# Patient Record
Sex: Male | Born: 1990
Health system: Southern US, Community
[De-identification: ages and names within clinical notes are randomized; demographics above are authoritative.]

## PROBLEM LIST (undated history)

## (undated) DIAGNOSIS — F419 Anxiety disorder, unspecified: Secondary | ICD-10-CM

## (undated) DIAGNOSIS — L0501 Pilonidal cyst with abscess: Secondary | ICD-10-CM

## (undated) DIAGNOSIS — I10 Essential (primary) hypertension: Secondary | ICD-10-CM

## (undated) DIAGNOSIS — R569 Unspecified convulsions: Secondary | ICD-10-CM

## (undated) DIAGNOSIS — F32A Depression, unspecified: Secondary | ICD-10-CM

## (undated) DIAGNOSIS — F329 Major depressive disorder, single episode, unspecified: Secondary | ICD-10-CM

## (undated) DIAGNOSIS — F1910 Other psychoactive substance abuse, uncomplicated: Secondary | ICD-10-CM

## (undated) DIAGNOSIS — T50901A Poisoning by unspecified drugs, medicaments and biological substances, accidental (unintentional), initial encounter: Secondary | ICD-10-CM

## (undated) HISTORY — DX: Major depressive disorder, single episode, unspecified: F32.9

## (undated) HISTORY — PX: INCISE AND DRAIN ABCESS: PRO64

## (undated) HISTORY — DX: Anxiety disorder, unspecified: F41.9

## (undated) HISTORY — DX: Pilonidal cyst with abscess: L05.01

## (undated) HISTORY — DX: Depression, unspecified: F32.A

---
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
## 2008-04-24 ENCOUNTER — Emergency Department (HOSPITAL_COMMUNITY): Admission: EM | Admit: 2008-04-24 | Discharge: 2008-04-24 | Payer: Self-pay | Admitting: Emergency Medicine

## 2009-03-12 ENCOUNTER — Emergency Department (HOSPITAL_COMMUNITY): Admission: EM | Admit: 2009-03-12 | Discharge: 2009-03-13 | Payer: Self-pay | Admitting: Emergency Medicine

## 2010-07-10 LAB — COMPREHENSIVE METABOLIC PANEL
ALT: 13 U/L (ref 0–53)
AST: 20 U/L (ref 0–37)
Albumin: 4.4 g/dL (ref 3.5–5.2)
Alkaline Phosphatase: 69 U/L (ref 39–117)
BUN: 7 mg/dL (ref 6–23)
Calcium: 9 mg/dL (ref 8.4–10.5)
Chloride: 104 mEq/L (ref 96–112)
GFR calc non Af Amer: >60 mL/min (ref >60)
Total Bilirubin: 0.5 mg/dL (ref 0.3–1.2)

## 2010-07-10 LAB — URINALYSIS, ROUTINE W REFLEX MICROSCOPIC
Bilirubin Urine: NEGATIVE
Ketones, ur: NEGATIVE mg/dL
Leukocytes, UA: NEGATIVE
Nitrite: NEGATIVE
Specific Gravity, Urine: 1.013 (ref 1.005–1.030)

## 2010-07-10 LAB — RAPID URINE DRUG SCREEN, HOSP PERFORMED
Amphetamines: NOT DETECTED
Barbiturates: NOT DETECTED
Opiates: NOT DETECTED
Tetrahydrocannabinol: POSITIVE — AB

## 2010-07-10 LAB — ETHANOL: Alcohol, Ethyl (B): <5 mg/dL (ref 0–10)

## 2010-07-10 LAB — POCT CARDIAC MARKERS
CKMB, poc: <1 ng/mL — ABNORMAL LOW (ref 1.0–8.0)
Myoglobin, poc: 108 ng/mL (ref 12–200)
Troponin i, poc: <0.05 ng/mL (ref 0.00–0.09)

## 2010-07-10 LAB — CBC
HCT: 45.2 % (ref 39.0–52.0)
Hemoglobin: 15.4 g/dL (ref 13.0–17.0)
MCHC: 34 g/dL (ref 30.0–36.0)
RBC: 4.96 MIL/uL (ref 4.22–5.81)
WBC: 15.6 10*3/uL — ABNORMAL HIGH (ref 4.0–10.5)

## 2010-07-10 LAB — DIFFERENTIAL
Basophils Absolute: 0 10*3/uL (ref 0.0–0.1)
Eosinophils Absolute: 0.2 10*3/uL (ref 0.0–0.7)
Monocytes Relative: 7 % (ref 3–12)

## 2010-07-10 LAB — URINE MICROSCOPIC-ADD ON

## 2010-11-29 ENCOUNTER — Emergency Department (HOSPITAL_BASED_OUTPATIENT_CLINIC_OR_DEPARTMENT_OTHER)
Admission: EM | Admit: 2010-11-29 | Discharge: 2010-11-29 | Disposition: A | Discharge status: Home / Self Care | Payer: Managed Care, Other (non HMO) | Attending: Emergency Medicine | Admitting: Emergency Medicine

## 2010-11-29 ENCOUNTER — Encounter: Payer: Self-pay | Admitting: *Deleted

## 2010-11-29 DIAGNOSIS — E860 Dehydration: Secondary | ICD-10-CM | POA: Insufficient documentation

## 2010-11-29 DIAGNOSIS — F17200 Nicotine dependence, unspecified, uncomplicated: Secondary | ICD-10-CM | POA: Insufficient documentation

## 2010-11-29 DIAGNOSIS — R5381 Other malaise: Secondary | ICD-10-CM | POA: Insufficient documentation

## 2010-11-29 DIAGNOSIS — T675XXA Heat exhaustion, unspecified, initial encounter: Secondary | ICD-10-CM | POA: Insufficient documentation

## 2010-11-29 DIAGNOSIS — X30XXXA Exposure to excessive natural heat, initial encounter: Secondary | ICD-10-CM | POA: Insufficient documentation

## 2010-11-29 LAB — BASIC METABOLIC PANEL
Calcium: 10 mg/dL (ref 8.4–10.5)
Chloride: 96 mEq/L (ref 96–112)
GFR calc Af Amer: 57 mL/min — ABNORMAL LOW (ref >60)
GFR calc non Af Amer: 47 mL/min — ABNORMAL LOW (ref >60)

## 2010-11-29 MED ORDER — SODIUM CHLORIDE 0.9 % IV BOLUS (SEPSIS)
1000.0000 mL | Freq: Once | INTRAVENOUS | Status: AC
  Administered 2010-11-29: 1000 mL via INTRAVENOUS

## 2010-11-29 NOTE — ED Notes (Signed)
Pt to room 1 by ems via stretcher. Pt coming from his work, per pt he has been working outside for the last few days with little po intake.  Pt is alert, sleepy, states he did not sleep last night and he "feels tired".

## 2010-11-29 NOTE — ED Provider Notes (Signed)
History     CSN: [id number] Arrival date & time: 11/29/2010  4:17 PM  Chief Complaint  Patient presents with  . Heat Exposure   HPI Comments: Pt come in today complaining of generalized fatigue.Pt states that he works out in the heat and just feels like he couldn't keep up with his hydration today:pt states that he only slept 2 hours yesterday;pt states that he drank some etoh last night  The history is provided by the patient. No language interpreter was used.    Past Medical History  Diagnosis Date  . Migraine     History reviewed. No pertinent past surgical history.  History reviewed. No pertinent family history.  History  Substance Use Topics  . Smoking status: Current Everyday Smoker  . Smokeless tobacco: Not on file  . Alcohol Use: Yes      Review of Systems  All other systems reviewed and are negative.    Physical Exam  BP 113/71  Pulse 91  Temp(Src) 98.5 F (36.9 C) (Oral)  Resp 16  SpO2 97%  Physical Exam  Nursing note and vitals reviewed. Constitutional: He is oriented to person, place, and time. He appears well-developed.  HENT:  Head: Normocephalic and atraumatic.  Eyes: Pupils are equal, round, and reactive to light.  Neck: Normal range of motion. Neck supple.  Cardiovascular: Normal rate and regular rhythm.   Pulmonary/Chest: Effort normal and breath sounds normal.  Abdominal: Soft. Bowel sounds are normal.  Musculoskeletal: Normal range of motion.  Neurological: He is alert and oriented to person, place, and time.  Skin: Skin is warm and dry.  Psychiatric: He has a normal mood and affect. His behavior is normal.    ED Course  Procedures Results for orders placed during the hospital encounter of 11/29/10  BASIC METABOLIC PANEL      Component Value Range   Sodium 135  135 - 145 (mEq/L)   Potassium 3.9  3.5 - 5.1 (mEq/L)   Chloride 96  96 - 112 (mEq/L)   CO2 24  19 - 32 (mEq/L)   Glucose, Bld 111 (*) 70 - 99 (mg/dL)   BUN 19  6 - 23  (mg/dL)   Creatinine, Ser 8.29 (*) 0.50 - 1.35 (mg/dL)   Calcium 56.2  8.4 - 10.5 (mg/dL)   GFR calc non Af Amer 47 (*) >60 (mL/min)   GFR calc Af Amer 57 (*) >60 (mL/min)   No results found.  MDM Pt given 2 liters of fluids here:will have pt have his labs redrawn at pcp office:pt feeling better at time:pt tolerating po without any problem:pt is ambulatory without any problem  Medical screening examination/treatment/procedure(s) were conducted as a shared visit with non-physician practitioner(s) and myself.  I personally evaluated the patient during the encounter Pt seen --> he had been gathering up shopping carts outside a store and felt weak.  He had had only a couple of hours asleep the night before and had done some drinking that night.  Exam shows him to be somewhat sunburned.  Lungs clear, heartsounds normal, abdomen soft and non-tender.  Neurologically intact.  He was rehydrated and improved.    Teressa Lower, NP 11/29/10 1922  Carleene Cooper III, MD 11/30/10 1351

## 2011-01-15 LAB — OCCULT BLOOD X 1 CARD TO LAB, STOOL: Fecal Occult Bld: POSITIVE

## 2011-03-20 ENCOUNTER — Ambulatory Visit (INDEPENDENT_AMBULATORY_CARE_PROVIDER_SITE_OTHER): Payer: Managed Care, Other (non HMO) | Admitting: Surgery

## 2011-03-20 ENCOUNTER — Encounter (INDEPENDENT_AMBULATORY_CARE_PROVIDER_SITE_OTHER): Payer: Self-pay | Admitting: Surgery

## 2011-03-20 ENCOUNTER — Other Ambulatory Visit (INDEPENDENT_AMBULATORY_CARE_PROVIDER_SITE_OTHER): Payer: Self-pay | Admitting: Surgery

## 2011-03-20 VITALS — BP 122/84 | HR 98 | Temp 97.6°F | Resp 16 | Ht 75.0 in | Wt 242.0 lb

## 2011-03-20 DIAGNOSIS — L0501 Pilonidal cyst with abscess: Secondary | ICD-10-CM

## 2011-03-20 HISTORY — DX: Pilonidal cyst with abscess: L05.01

## 2011-03-20 MED ORDER — CEPHALEXIN 500 MG PO CAPS: 500.0000 mg | ORAL_CAPSULE | Freq: Four times a day (QID) | ORAL | Status: AC

## 2011-03-20 NOTE — Progress Notes (Signed)
Chief complaint: Abscess  History of present illness: This patient presents to our urgent office with a five day history of pain around his tailbone which has been getting worse. He has been told by his family there is some redness in that they thought he had an abscess. He's never had anything like this in the past. He otherwise considers himself to be in good health.  Past history and family history etc. are all already in the electronic medical record and reviewed.  Physical exam:  Gen.: The patient is alert oriented and generally healthy-appearing. He is in no distress. Back: There is an abscess in the pilonidal area presenting just to the left of the midline. There are at least two sinus tract openings. The area is red, fluctuant, and tender.  Impression acute pilonidal abscess with cyst  Plan: I think this needs to be opened and drained. I discussed that with the patient. I told him that he would need to start on some antibiotics as well. I explained that we would not be able to close this but it would have to heal from the "bottom up." I also told him that he may have to have subsequent surgery for excision depending on how this heals. The patient was agreeable to having this open this.  Procedure note: The area of the pilonidal abscess was prepped with Betadine and anesthetized with 1% Xylocaine with epinephrine. I waited 10 minutes for good anesthesia and hemostatic effect.  The area was reprepped with Betadine. I made an elliptical incision close to the midline but over the most fluctuant area and entered an abscess cavity. Foul-smelling purulent material was drained and cultured. When I saw and all the fluid drained and placed a small pack with a 2 x 2.  The patient tolerated the procedure well.  He will return in two days for dressing change by the nurse. I will see him in a week. He will start Keflex 500 q.i.d.  

## 2011-03-20 NOTE — Patient Instructions (Signed)
Change dressing daily. Start antibiotics as prescribed. You may shower or do Sitz baths starting tomorrow. We should see you again in about a week

## 2011-03-23 LAB — WOUND CULTURE

## 2011-03-26 ENCOUNTER — Encounter (INDEPENDENT_AMBULATORY_CARE_PROVIDER_SITE_OTHER): Payer: Self-pay | Admitting: Surgery

## 2011-03-26 ENCOUNTER — Ambulatory Visit (INDEPENDENT_AMBULATORY_CARE_PROVIDER_SITE_OTHER): Payer: Managed Care, Other (non HMO) | Admitting: Surgery

## 2011-03-26 VITALS — BP 118/88 | HR 68 | Temp 96.8°F | Resp 16 | Ht 75.0 in | Wt 240.6 lb

## 2011-03-26 DIAGNOSIS — L0501 Pilonidal cyst with abscess: Secondary | ICD-10-CM

## 2011-03-26 NOTE — Progress Notes (Signed)
Chief complaint: Followup after drainage of pilonidal abscess done in the office  History of present illness: The patient presented last week to our urgent office with a pilonidal abscess. This was drained. The patient believes he is doing better and is feeling a lot less pain. He continues to have a little bit of drainage. He considers himself to be much improved from prior to drainage of the abscess.  Exam: The abscess is starting to close up although there is still a little purulent drainage. Just inferior to the I&D site is a small pilonidal sinus opening which had some hair growing out of it. I removed that.  Impression improving pilonidal abscess  Plan: We will continue him on antibiotics. He'll follow up with me in 2-3 weeks. I told him that this may completely resolve or that he may end up having to have a surgical excision but that we can't tell yet whether he will need surgery or not. We can make a better judgment after we see how this heals over the next few weeks.

## 2011-03-26 NOTE — Patient Instructions (Signed)
Continue current treatment and finish all the antibiotics. See me again in 2-3 weeks

## 2011-04-08 ENCOUNTER — Ambulatory Visit (INDEPENDENT_AMBULATORY_CARE_PROVIDER_SITE_OTHER): Payer: Managed Care, Other (non HMO) | Admitting: Surgery

## 2011-04-08 ENCOUNTER — Encounter (INDEPENDENT_AMBULATORY_CARE_PROVIDER_SITE_OTHER): Payer: Self-pay | Admitting: Surgery

## 2011-04-08 VITALS — BP 122/90 | HR 73 | Temp 97.2°F | Ht 75.0 in | Wt 247.0 lb

## 2011-04-08 DIAGNOSIS — L0501 Pilonidal cyst with abscess: Secondary | ICD-10-CM

## 2011-04-08 MED ORDER — OXYCODONE-ACETAMINOPHEN 10-325 MG PO TABS: 1.0000 | ORAL_TABLET | Freq: Four times a day (QID) | ORAL | Status: AC | PRN

## 2011-04-08 NOTE — Progress Notes (Signed)
NAME: Bradley Hanson                                            DOB: 1991/04/01 DATE: 04/08/2011                                                  MRN: [id number]  CC: Post op   HPI: This patient comes in for post op follow-up.Heunderwent I&D of pilonidal on 12/18. He feels that he is doing well.He notes that he takes percocet for migraine and used some for his pilnidal abscess and is out. Will rev  PE: General: The patient appears to be healthy, NAD Abscess has healed  DATA REVIEWED: No new  IMPRESSION: The patient is doing well S/P I&D pilonidal. Discussed this with him and told him he may have recurrences and may need more definitive surgery. Marland Kitchen    PLAN: RTC PRN

## 2011-09-10 ENCOUNTER — Telehealth (INDEPENDENT_AMBULATORY_CARE_PROVIDER_SITE_OTHER): Payer: Self-pay

## 2011-09-10 DIAGNOSIS — L0591 Pilonidal cyst without abscess: Secondary | ICD-10-CM

## 2011-09-10 MED ORDER — CEPHALEXIN 500 MG PO CAPS: 500.0000 mg | ORAL_CAPSULE | Freq: Four times a day (QID) | ORAL | Status: AC

## 2011-09-10 NOTE — Telephone Encounter (Signed)
The dad called and reports the patient's pilonidal cyst is inflamed again.  It drained last night.  He was seen in December and was on Keflex at one point.  He uses [facility] on [address].  I spoke to Dr Jamey Ripa who gave the order for Keflex 500 mg po qid #40 no refills.  I called this to [facility] [phone].  I made an appointment for tomorrow in urgent clinic.

## 2011-09-11 ENCOUNTER — Encounter (INDEPENDENT_AMBULATORY_CARE_PROVIDER_SITE_OTHER): Payer: Self-pay | Admitting: Surgery

## 2011-09-11 ENCOUNTER — Ambulatory Visit (INDEPENDENT_AMBULATORY_CARE_PROVIDER_SITE_OTHER): Payer: Managed Care, Other (non HMO) | Admitting: Surgery

## 2011-09-11 VITALS — BP 125/96 | HR 111 | Temp 98.1°F | Ht 75.0 in | Wt 231.0 lb

## 2011-09-11 DIAGNOSIS — L0501 Pilonidal cyst with abscess: Secondary | ICD-10-CM

## 2011-09-11 NOTE — Progress Notes (Signed)
Subjective:     Patient ID: Bradley Hanson, male   DOB: [birth year], 21 y.o.   MRN: [id number]  HPI This is a gentleman with a history of possible abscesses in the past. His last time he was drained was in December of last year for his report. He is now developed another area of tenderness in his gluteal cleft. He will occasionally push on it and allowed to drain on its own. He has just been started on Keflex  Review of Systems     Objective:   Physical Exam On exam, he has a small abscess in the gluteal cleft consistent with a pilonidal infection. I prepped the area Betadine, made an incision with a scalpel, and drained a small abscess cavity which had been packed with gauze    Assessment:     Pilonidal abscess    Plan:     He will continue his Keflex. I wrote in for a few Percocet. Wound care instructions were given. I will see him back in 3 or 4 weeks to discuss potential for definitive surgery.

## 2011-09-13 ENCOUNTER — Telehealth (INDEPENDENT_AMBULATORY_CARE_PROVIDER_SITE_OTHER): Payer: Self-pay | Admitting: General Surgery

## 2011-09-13 NOTE — Telephone Encounter (Signed)
Pt called in stating he had pilo cyst I&D on 09/11/11 by Magnus Ivan. Pt requested additional percocet stating it was still hurting really bad. The patient sounded sluggish on the phone. I advised him a call would be made to Dr. Magnus Ivan to ask if the script can be issued. Patient advised he can be called back on home # [phone] or his cell # [phone].  Dr. Magnus Ivan called back and advised he was not going to issue any further pain medication to this patient. Advised he can use aleve or ibuprofen.  Called patient back and advised of what Dr.Blackman stated. Patient said o.k.

## 2011-09-30 ENCOUNTER — Telehealth (INDEPENDENT_AMBULATORY_CARE_PROVIDER_SITE_OTHER): Payer: Self-pay | Admitting: General Surgery

## 2011-09-30 MED ORDER — OXYCODONE-ACETAMINOPHEN 5-325 MG PO TABS: 1.0000 | ORAL_TABLET | Freq: Four times a day (QID) | ORAL | Status: AC | PRN

## 2011-09-30 NOTE — Telephone Encounter (Signed)
Pt calling to request additional antibx for abscess on buttock.  Paged and updated Dr. Magnus Ivan; orders received for Keflex 500 mg, #30, 1 po TID x 10 days, no refill.  Called meds to [facility]:  [phone] and notified pt to pick up.

## 2011-09-30 NOTE — Telephone Encounter (Signed)
Patient called status post appts for pilonidal cyst in office. Last appt was 09/11/2011. He was given Percocet at that time for pain. Patient states this area has opened again and is very painful and is asking for percocet refill. Dr Magnus Ivan paged. Ok per Dr Magnus Ivan to refill #30 and to have partner write RX. Dr Michaell Cowing signed RX at front for patient pick up. Patient aware.

## 2011-10-04 ENCOUNTER — Encounter (INDEPENDENT_AMBULATORY_CARE_PROVIDER_SITE_OTHER): Payer: Self-pay | Admitting: Surgery

## 2011-10-09 ENCOUNTER — Ambulatory Visit (INDEPENDENT_AMBULATORY_CARE_PROVIDER_SITE_OTHER): Payer: Managed Care, Other (non HMO) | Admitting: Surgery

## 2011-10-09 ENCOUNTER — Encounter (INDEPENDENT_AMBULATORY_CARE_PROVIDER_SITE_OTHER): Payer: Self-pay | Admitting: Surgery

## 2011-10-09 VITALS — BP 136/94 | HR 66 | Temp 97.6°F | Ht 75.0 in | Wt 229.2 lb

## 2011-10-09 DIAGNOSIS — L0501 Pilonidal cyst with abscess: Secondary | ICD-10-CM

## 2011-10-09 NOTE — Progress Notes (Signed)
Subjective:     Patient ID: Bradley Hanson, male   DOB: [birth year], 21 y.o.   MRN: [id number]  HPI He has had no problem since I saw him last for his pilonidal abscess. He has completely healed and has no complaints.  Review of Systems     Objective:   Physical Exam On exam, there are chronic sinus tracts without drainage and without evidence of infection at the gluteal cleft    Assessment:     Chronic pilonidal cyst    Plan:     For now, he will to hold on definitive surgery which I feel is reasonable. He will call me as soon as possible when he has another flareup and we will start him on antibiotics right away

## 2011-11-12 ENCOUNTER — Encounter (INDEPENDENT_AMBULATORY_CARE_PROVIDER_SITE_OTHER): Payer: Self-pay | Admitting: Surgery

## 2011-11-12 ENCOUNTER — Ambulatory Visit (INDEPENDENT_AMBULATORY_CARE_PROVIDER_SITE_OTHER): Payer: Managed Care, Other (non HMO) | Admitting: Surgery

## 2011-11-12 VITALS — BP 118/64 | HR 72 | Temp 97.8°F | Resp 12 | Ht 75.0 in | Wt 230.8 lb

## 2011-11-12 DIAGNOSIS — L0501 Pilonidal cyst with abscess: Secondary | ICD-10-CM

## 2011-11-12 NOTE — Progress Notes (Signed)
Subjective:     Patient ID: Bradley Hanson, male   DOB: Sep 29, 1990, 21 y.o.   MRN: [id number]  HPI He is here for another evaluation of his pilonidal abscess.  It has recurred and spontaneously drained last evening.  Review of Systems     Objective:   Physical Exam    on exam, there is a small abscess cavity which I probed with a Q-tip. This is at the gluteal cleft. Assessment:     Recurrent pilonidal cyst with abscess    Plan:     I am going to place him back on antibiotics. I will now schedule him for definitive surgery. Risks were discussed with the patient and his father

## 2011-11-18 ENCOUNTER — Telehealth (INDEPENDENT_AMBULATORY_CARE_PROVIDER_SITE_OTHER): Payer: Self-pay | Admitting: General Surgery

## 2011-11-18 NOTE — Telephone Encounter (Signed)
PT CALLED TO REQUEST PAIN MEDICATION REFILL. SEEN IN OFFICE LAST WEEK FOR PILONIDAL CYST/ REVIEWED WITH DR. BLACKMAN AND HE APPROVED A CALL IN FOR HYDROCODONE 5/325 #30 PER PROTOCOL/ CALLED TO [facility] [address] [phone]. PT NOTIFIED/GY

## 2013-12-18 ENCOUNTER — Encounter (HOSPITAL_COMMUNITY): Payer: Self-pay | Admitting: Emergency Medicine

## 2013-12-18 ENCOUNTER — Inpatient Hospital Stay (HOSPITAL_COMMUNITY)
Admission: EM | Admit: 2013-12-18 | Discharge: 2013-12-23 | DRG: 137 | Disposition: A | Discharge status: Home / Self Care | Payer: Self-pay | Attending: Family Medicine | Admitting: Family Medicine

## 2013-12-18 ENCOUNTER — Emergency Department (HOSPITAL_COMMUNITY): Payer: Self-pay

## 2013-12-18 DIAGNOSIS — Z598 Other problems related to housing and economic circumstances: Secondary | ICD-10-CM

## 2013-12-18 DIAGNOSIS — K029 Dental caries, unspecified: Secondary | ICD-10-CM | POA: Diagnosis present

## 2013-12-18 DIAGNOSIS — K0401 Reversible pulpitis: Secondary | ICD-10-CM | POA: Diagnosis present

## 2013-12-18 DIAGNOSIS — Z5987 Material hardship due to limited financial resources, not elsewhere classified: Secondary | ICD-10-CM

## 2013-12-18 DIAGNOSIS — M272 Inflammatory conditions of jaws: Secondary | ICD-10-CM | POA: Diagnosis present

## 2013-12-18 DIAGNOSIS — L0501 Pilonidal cyst with abscess: Secondary | ICD-10-CM

## 2013-12-18 DIAGNOSIS — F17200 Nicotine dependence, unspecified, uncomplicated: Secondary | ICD-10-CM | POA: Diagnosis present

## 2013-12-18 DIAGNOSIS — K047 Periapical abscess without sinus: Principal | ICD-10-CM | POA: Diagnosis present

## 2013-12-18 DIAGNOSIS — Z23 Encounter for immunization: Secondary | ICD-10-CM

## 2013-12-18 DIAGNOSIS — F3289 Other specified depressive episodes: Secondary | ICD-10-CM | POA: Diagnosis present

## 2013-12-18 DIAGNOSIS — L0201 Cutaneous abscess of face: Secondary | ICD-10-CM | POA: Diagnosis present

## 2013-12-18 DIAGNOSIS — K0530 Chronic periodontitis, unspecified: Secondary | ICD-10-CM | POA: Diagnosis present

## 2013-12-18 DIAGNOSIS — F329 Major depressive disorder, single episode, unspecified: Secondary | ICD-10-CM | POA: Diagnosis present

## 2013-12-18 DIAGNOSIS — L03211 Cellulitis of face: Secondary | ICD-10-CM

## 2013-12-18 DIAGNOSIS — K045 Chronic apical periodontitis: Secondary | ICD-10-CM | POA: Diagnosis present

## 2013-12-18 DIAGNOSIS — K036 Deposits [accretions] on teeth: Secondary | ICD-10-CM | POA: Diagnosis present

## 2013-12-18 DIAGNOSIS — F411 Generalized anxiety disorder: Secondary | ICD-10-CM | POA: Diagnosis present

## 2013-12-18 DIAGNOSIS — X58XXXA Exposure to other specified factors, initial encounter: Secondary | ICD-10-CM | POA: Diagnosis present

## 2013-12-18 LAB — I-STAT CHEM 8, ED
BUN: 12 mg/dL (ref 6–23)
CREATININE: 1.4 mg/dL — AB (ref 0.50–1.35)
Calcium, Ion: 1.21 mmol/L (ref 1.12–1.23)
Chloride: 104 mEq/L (ref 96–112)
Glucose, Bld: 103 mg/dL — ABNORMAL HIGH (ref 70–99)
HCT: 49 % (ref 39.0–52.0)
HEMOGLOBIN: 16.7 g/dL (ref 13.0–17.0)
POTASSIUM: 4.4 meq/L (ref 3.7–5.3)
SODIUM: 138 meq/L (ref 137–147)
TCO2: 31 mmol/L (ref 0–100)

## 2013-12-18 LAB — CBC
HCT: 45.2 % (ref 39.0–52.0)
HCT: 43 % (ref 39.0–52.0)
HEMOGLOBIN: 14.7 g/dL (ref 13.0–17.0)
Hemoglobin: 14.1 g/dL (ref 13.0–17.0)
MCH: 30 pg (ref 26.0–34.0)
MCH: 29.9 pg (ref 26.0–34.0)
MCHC: 32.5 g/dL (ref 30.0–36.0)
MCHC: 32.8 g/dL (ref 30.0–36.0)
MCV: 92.2 fL (ref 78.0–100.0)
MCV: 91.3 fL (ref 78.0–100.0)
PLATELETS: 340 10*3/uL (ref 150–400)
Platelets: 242 10*3/uL (ref 150–400)
RBC: 4.9 MIL/uL (ref 4.22–5.81)
RBC: 4.71 MIL/uL (ref 4.22–5.81)
RDW: 13.6 % (ref 11.5–15.5)
RDW: 13.6 % (ref 11.5–15.5)
WBC: 12.8 10*3/uL — ABNORMAL HIGH (ref 4.0–10.5)
WBC: 11.3 10*3/uL — ABNORMAL HIGH (ref 4.0–10.5)

## 2013-12-18 LAB — CREATININE, SERUM
CREATININE: 1.03 mg/dL (ref 0.50–1.35)
GFR calc Af Amer: >90 mL/min (ref >90)
GFR calc non Af Amer: >90 mL/min (ref >90)

## 2013-12-18 LAB — MRSA PCR SCREENING: MRSA by PCR: NEGATIVE

## 2013-12-18 MED ORDER — ONDANSETRON HCL 4 MG PO TABS: 4.0000 mg | ORAL_TABLET | Freq: Four times a day (QID) | ORAL | Status: DC | PRN

## 2013-12-18 MED ORDER — MORPHINE SULFATE 2 MG/ML IJ SOLN
1.0000 mg | INTRAMUSCULAR | Status: DC | PRN
  Administered 2013-12-18 – 2013-12-20 (×11): 2 mg via INTRAVENOUS
  Filled 2013-12-18 (×11): qty 1

## 2013-12-18 MED ORDER — HYDROMORPHONE HCL PF 1 MG/ML IJ SOLN
1.0000 mg | Freq: Once | INTRAMUSCULAR | Status: AC
  Administered 2013-12-18: 1 mg via INTRAVENOUS
  Filled 2013-12-18: qty 1

## 2013-12-18 MED ORDER — ONDANSETRON HCL 4 MG/2ML IJ SOLN: 4.0000 mg | Freq: Three times a day (TID) | INTRAMUSCULAR | Status: DC | PRN

## 2013-12-18 MED ORDER — CLONAZEPAM 1 MG PO TABS
1.0000 mg | ORAL_TABLET | Freq: Three times a day (TID) | ORAL | Status: DC | PRN
  Administered 2013-12-18 – 2013-12-23 (×12): 1 mg via ORAL
  Filled 2013-12-18 (×12): qty 1

## 2013-12-18 MED ORDER — PNEUMOCOCCAL VAC POLYVALENT 25 MCG/0.5ML IJ INJ
0.5000 mL | INJECTION | INTRAMUSCULAR | Status: DC
  Filled 2013-12-18: qty 0.5

## 2013-12-18 MED ORDER — SODIUM CHLORIDE 0.9 % IV SOLN
INTRAVENOUS | Status: DC
  Administered 2013-12-18 – 2013-12-20 (×4): via INTRAVENOUS

## 2013-12-18 MED ORDER — IOHEXOL 300 MG/ML  SOLN
75.0000 mL | Freq: Once | INTRAMUSCULAR | Status: AC | PRN
  Administered 2013-12-18: 75 mL via INTRAVENOUS

## 2013-12-18 MED ORDER — SODIUM CHLORIDE 0.9 % IV BOLUS (SEPSIS)
1000.0000 mL | Freq: Once | INTRAVENOUS | Status: AC
  Administered 2013-12-18: 1000 mL via INTRAVENOUS

## 2013-12-18 MED ORDER — CLINDAMYCIN PHOSPHATE 600 MG/50ML IV SOLN
600.0000 mg | Freq: Once | INTRAVENOUS | Status: AC
Start: 2013-12-18 — End: 2013-12-18
  Administered 2013-12-18: 600 mg via INTRAVENOUS
  Filled 2013-12-18: qty 50

## 2013-12-18 MED ORDER — GUAIFENESIN-DM 100-10 MG/5ML PO SYRP: 5.0000 mL | ORAL_SOLUTION | ORAL | Status: DC | PRN

## 2013-12-18 MED ORDER — CLINDAMYCIN PHOSPHATE 600 MG/50ML IV SOLN
600.0000 mg | Freq: Four times a day (QID) | INTRAVENOUS | Status: DC
  Administered 2013-12-18 – 2013-12-23 (×20): 600 mg via INTRAVENOUS
  Filled 2013-12-18 (×23): qty 50

## 2013-12-18 MED ORDER — OXYCODONE HCL 5 MG PO TABS
5.0000 mg | ORAL_TABLET | ORAL | Status: DC | PRN
  Administered 2013-12-18 (×2): 5 mg via ORAL
  Filled 2013-12-18 (×2): qty 1

## 2013-12-18 MED ORDER — OXYCODONE-ACETAMINOPHEN 5-325 MG PO TABS
1.0000 | ORAL_TABLET | Freq: Once | ORAL | Status: AC
  Administered 2013-12-18: 1 via ORAL
  Filled 2013-12-18: qty 1

## 2013-12-18 MED ORDER — OXYCODONE HCL 5 MG PO TABS
5.0000 mg | ORAL_TABLET | ORAL | Status: DC | PRN
  Administered 2013-12-18 – 2013-12-19 (×3): 5 mg via ORAL
  Administered 2013-12-19: 10 mg via ORAL
  Administered 2013-12-19: 5 mg via ORAL
  Administered 2013-12-19 – 2013-12-21 (×9): 10 mg via ORAL
  Filled 2013-12-18: qty 1
  Filled 2013-12-18 (×2): qty 2
  Filled 2013-12-18: qty 1
  Filled 2013-12-18 (×2): qty 2
  Filled 2013-12-18: qty 1
  Filled 2013-12-18 (×2): qty 2
  Filled 2013-12-18: qty 1
  Filled 2013-12-18 (×4): qty 2

## 2013-12-18 MED ORDER — HYDROMORPHONE HCL PF 1 MG/ML IJ SOLN: 1.0000 mg | INTRAMUSCULAR | Status: DC | PRN

## 2013-12-18 MED ORDER — ONDANSETRON HCL 4 MG/2ML IJ SOLN
4.0000 mg | Freq: Once | INTRAMUSCULAR | Status: AC
  Administered 2013-12-18: 4 mg via INTRAVENOUS
  Filled 2013-12-18: qty 2

## 2013-12-18 MED ORDER — ACETAMINOPHEN 650 MG RE SUPP: 650.0000 mg | Freq: Four times a day (QID) | RECTAL | Status: DC | PRN

## 2013-12-18 MED ORDER — ACETAMINOPHEN 325 MG PO TABS
650.0000 mg | ORAL_TABLET | Freq: Four times a day (QID) | ORAL | Status: DC | PRN
  Administered 2013-12-23: 650 mg via ORAL
  Filled 2013-12-18: qty 2

## 2013-12-18 MED ORDER — HEPARIN SODIUM (PORCINE) 5000 UNIT/ML IJ SOLN
5000.0000 [IU] | Freq: Three times a day (TID) | INTRAMUSCULAR | Status: DC
  Administered 2013-12-18 – 2013-12-21 (×9): 5000 [IU] via SUBCUTANEOUS
  Filled 2013-12-18 (×15): qty 1

## 2013-12-18 MED ORDER — ALBUTEROL SULFATE (2.5 MG/3ML) 0.083% IN NEBU: 2.5000 mg | INHALATION_SOLUTION | RESPIRATORY_TRACT | Status: DC | PRN

## 2013-12-18 MED ORDER — ONDANSETRON HCL 4 MG/2ML IJ SOLN: 4.0000 mg | Freq: Four times a day (QID) | INTRAMUSCULAR | Status: DC | PRN

## 2013-12-18 NOTE — ED Provider Notes (Signed)
Medical screening examination/treatment/procedure(s) were conducted as a shared visit with non-physician practitioner(s) and myself.  I personally evaluated the patient during the encounter.  2 day history of right-sided lower dental pain with facial swelling and swelling of the floor of the mouth.  Patient with trismus, edema of floor of mouth with tenderness and submental tenderness. +Voice change. Uvula midline. No asymmetry. 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  Airway stable at this time. Antibiotics, steroids, d/w oral surgery/ENT    EKG Interpretation None        Glynn Octave, MD 12/18/13 [phone]

## 2013-12-18 NOTE — Consult Note (Signed)
Reason for Consult:dental abscess and floor of mouth edema Referring Physician: ER  Bradley Hanson is an 23 y.o. male.  HPI: 23 year old male with five days of right lower first molar pain.  He developed swelling along the mandible on that side early in the course.  Pain and swelling have worsened since yesterday making it difficult to sleep or swallow.  His speech is a bit slurred.  He presented to the ER.  He denies breathing difficulty.  Pain is improved after medication.  He is being admitted for IV antibiotic therapy by the hospitalist service.  Past Medical History  Diagnosis Date  . Migraine   . Anxiety   . Pilonidal cyst   . Depression   . Pilonidal cyst with abscess 03/20/2011    Past Surgical History  Procedure Laterality Date  . Cholecystectomy  2006  . Incise and drain abcess      pilo cyst    Family History  Problem Relation Age of Onset  . Heart disease Paternal Grandmother     Social History:  reports that he has been smoking Cigarettes.  He has been smoking about 0.25 packs per day. He does not have any smokeless tobacco history on file. He reports that he drinks alcohol. He reports that he does not use illicit drugs.  Allergies: No Known Allergies  Medications: I have reviewed the patient's current medications.  Results for orders placed during the hospital encounter of 12/18/13 (from the past 48 hour(s))  CBC     Status: Abnormal   Collection Time    12/18/13  8:59 AM      Result Value Ref Range   WBC 12.8 (*) 4.0 - 10.5 K/uL   RBC 4.90  4.22 - 5.81 MIL/uL   Hemoglobin 14.7  13.0 - 17.0 g/dL   HCT 69.6  29.5 - 28.4 %   MCV 92.2  78.0 - 100.0 fL   MCH 30.0  26.0 - 34.0 pg   MCHC 32.5  30.0 - 36.0 g/dL   RDW 13.2  44.0 - 10.2 %   Platelets 340  150 - 400 K/uL  I-STAT CHEM 8, ED     Status: Abnormal   Collection Time    12/18/13  9:06 AM      Result Value Ref Range   Sodium 138  137 - 147 mEq/L   Potassium 4.4  3.7 - 5.3 mEq/L   Chloride 104  96 - 112  mEq/L   BUN 12  6 - 23 mg/dL   Creatinine, Ser 7.25 (*) 0.50 - 1.35 mg/dL   Glucose, Bld 366 (*) 70 - 99 mg/dL   Calcium, Ion 4.40  3.47 - 1.23 mmol/L   TCO2 31  0 - 100 mmol/L   Hemoglobin 16.7  13.0 - 17.0 g/dL   HCT 42.5  95.6 - 38.7 %    Ct Soft Tissue Neck W Contrast  12/18/2013   CLINICAL DATA:  Dental pain and swelling under tongue. Trismus and voice change. Pain right side internal.  EXAM: CT NECK WITH CONTRAST  TECHNIQUE: Multidetector CT imaging of the neck was performed using the standard protocol following the bolus administration of intravenous contrast.  CONTRAST:  75mL OMNIPAQUE IOHEXOL 300 MG/ML  SOLN  COMPARISON:  None.  FINDINGS: There are periapical lucencies about the right mandibular tooth number 30. Extending medially from the periapical lucencies is a bony sinus tract extending through the medial cortex of the mandible.  At the level is bony sinus  tract, there is soft tissue thickening with central low density about the right aspect of the mandible consistent with a periapical abscess. This results in some mass effect on the floor of the mouth. No discrete rim enhancement is seen at this time. Periapical abscess measures approximately 4.0 cm AP diameter by 2.2 cm transverse diameter along the medial aspect of the mandible. There is also some soft tissue inflammation/thickening lateral to the mandible at this level.  There are multiple dental caries, involving at least the following teeth:  Maxilla:  Teeth numbers 2, 8, and 9  Mandible: Teeth numbers 28, 29, 30. There may be a small caries involving teeth 24 and 25.  Imaged portion of the brain is unremarkable.  There are reactive sized submental lymph nodes.  Negative for a of prevertebral edema.  IMPRESSION: 1. Periapical lucencies about mandibular tooth #30 with an associated bony sinus tract and adjacent periapical abscess.  2. Multiple dental caries.   Electronically Signed   By: Britta Mccreedy M.D.   On: 12/18/2013 11:03     Review of Systems  HENT: Positive for sore throat.   All other systems reviewed and are negative.  Blood pressure 120/80, pulse 78, temperature 98.8 F (37.1 C), temperature source Oral, resp. rate 12, height 6' 2.5" (1.892 m), weight 113.399 kg (250 lb), SpO2 96.00%. Physical Exam  Constitutional: He is oriented to person, place, and time. He appears well-developed and well-nourished. No distress.  HENT:  Head: Normocephalic and atraumatic.  Right Ear: External ear normal.  Left Ear: External ear normal.  Nose: Nose normal.  Floor of mouth with soft edema.  Right lingual surface of mandible with more focal area of swelling adjacent to the tender partial first molar root area.  No stridor.  Normal voice with slight slurring of speech.  No drooling.  Fairly normal tongue position.  Eyes: Conjunctivae and EOM are normal. Pupils are equal, round, and reactive to light.  Neck: Normal range of motion.  Submental region edematous and tender without fluctuance or skin redness.  Cardiovascular: Normal rate.   Respiratory: Effort normal.  Musculoskeletal: Normal range of motion.  Neurological: He is alert and oriented to person, place, and time. No cranial nerve deficit.  Skin: Skin is warm and dry.  Psychiatric: He has a normal mood and affect. His behavior is normal. Judgment and thought content normal.    Assessment/Plan: Right lower first molar abscess with perimandibular and submental/floor of mouth involvement I personally reviewed his neck CT with contrast showing a lucency around the right lower first molar root with a cortical defect in the mandible associated with soft tissue inflammation and edema of the perimandibular soft tissues.  There may be subperiosteal abscess within that location.  On exam, he had edema of the floor of mouth which is soft and is not affecting his airway.  His submental zone is edematous and tender.  He certainly needs admission for IV antibiotics and  airway observation.  I would recommend avoiding steroids unless he begins developing airway symptoms.  He likely needs extraction of the right lower first molar which is the only treatment that will cure his problem.  Extraction will allow any subperiosteal pus to drain as well.  I am told that there is no oral surgeon on call today for the hospital.  From an airway standpoint, he is safe to admit for treatment and wait until an oral surgeon is available.  If his swelling worsens, however, more urgent management of the  tooth will be necessary.  Will follow.  Aveah Castell 12/18/2013, 12:47 PM

## 2013-12-18 NOTE — ED Provider Notes (Signed)
CSN: [id number]     Arrival date & time 12/18/13  0815 History   First MD Initiated Contact with Patient 12/18/13 0840     Chief Complaint  Patient presents with  . Dental Pain  . Jaw Pain     (Consider location/radiation/quality/duration/timing/severity/associated sxs/prior Treatment) The history is provided by the patient. No language interpreter was used.    Bradley Hanson Is a 23 year old male who presents to the emergency department with chief complaint of dental pain and jaw pain. Patient has a known cavity in the right lower molar. He states about 5 days ago began having pain. He states the pain has progressively worsened in severity. He states his aunt and unable to eat or drink for the past 3-8 days. He has severe pain with swallowing. He notes significant swelling under his tongue and this morning his father states that his voice changed significantly. He rates the pain at 10 out of 10. He has associated trismus. He is able to tolerate his own secretions. He denies fevers, chills, nausea, vomiting.  Past Medical History  Diagnosis Date  . Migraine   . Anxiety   . Pilonidal cyst   . Depression   . Pilonidal cyst with abscess 03/20/2011   Past Surgical History  Procedure Laterality Date  . Cholecystectomy  2006  . Incise and drain abcess      pilo cyst   Family History  Problem Relation Age of Onset  . Heart disease Paternal Grandmother    History  Substance Use Topics  . Smoking status: Current Every Day Smoker -- 0.25 packs/day    Types: Cigarettes  . Smokeless tobacco: Not on file  . Alcohol Use: Yes    Review of Systems  HENT: Positive for dental problem, ear pain, facial swelling, sore throat, trouble swallowing and voice change. Negative for drooling.   All other systems reviewed and are negative.     Allergies  Review of patient's allergies indicates no known allergies.  Home Medications   Prior to Admission medications   Medication Sig Start Date  End Date Taking? Authorizing Provider  clonazePAM (KLONOPIN) 1 MG tablet Take 1 mg by mouth 2 (two) times daily as needed.      Historical Provider, MD  FLUoxetine (PROZAC) 40 MG capsule Take 40 mg by mouth daily.      Historical Provider, MD  oxyCODONE-acetaminophen (PERCOCET) 10-325 MG per tablet Take 1 tablet by mouth every 4 (four) hours as needed.      Historical Provider, MD  SUMAtriptan (IMITREX) 50 MG tablet Take 50 mg by mouth every 2 (two) hours as needed.      Historical Provider, MD   BP 127/84  Pulse 99  Temp(Src) 97.4 F (36.3 C) (Oral)  Resp 17  Ht 6' 2.5" (1.892 m)  Wt 250 lb (113.399 kg)  BMI 31.68 kg/m2  SpO2 100% Physical Exam  Nursing note and vitals reviewed. Constitutional: He appears well-developed and well-nourished. No distress.  Patient with significant facial and neck swelling, he appears very uncomfortable.  HENT:  Head: Normocephalic and atraumatic.  Mouth/Throat:    Eyes: Conjunctivae are normal. No scleral icterus.  Neck: Normal range of motion. Neck supple.  Cardiovascular: Normal rate, regular rhythm and normal heart sounds.   Pulmonary/Chest: Effort normal and breath sounds normal. No respiratory distress.  Abdominal: Soft. There is no tenderness.  Musculoskeletal: He exhibits no edema.  Neurological: He is alert.  Skin: Skin is warm and dry. He is not diaphoretic.  Psychiatric:  His behavior is normal.    ED Course  Procedures (including critical care time) Labs Review Labs Reviewed  CBC - Abnormal; Notable for the following:    WBC 12.8 (*)    All other components within normal limits  I-STAT CHEM 8, ED - Abnormal; Notable for the following:    Creatinine, Ser 1.40 (*)    Glucose, Bld 103 (*)    All other components within normal limits    Imaging Review No results found.   EKG Interpretation None     \  CRITICAL CARE Performed by: Arthor Captain   Total critical care time: 50  Critical care time was exclusive of  separately billable procedures and treating other patients.  Critical care was necessary to treat or prevent imminent or life-threatening deterioration.  Critical care was time spent personally by me on the following activities: development of treatment plan with patient and/or surrogate as well as nursing, discussions with consultants, evaluation of patient's response to treatment, examination of patient, obtaining history from patient or surrogate, ordering and performing treatments and interventions, ordering and review of laboratory studies, ordering and review of radiographic studies, pulse oximetry and re-evaluation of patient's condition.  MDM   Final diagnoses:  None   9:49 AM BP 127/84  Pulse 99  Temp(Src) 97.4 F (36.3 C) (Oral)  Resp 17  Ht 6' 2.5" (1.892 m)  Wt 250 lb (113.399 kg)  BMI 31.68 kg/m2  SpO2 100%  This is a patient with dental pain, significant swelling and concern for Ludwig's angina secondary to spread of infection. Ordered a CT soft tissues. The patient does have slightly elevated creatinine. However he has gotten a bolus of fluid and his creatinine elevation is likely secondary to decreased oral intake. His elevated white count at 13,000.    Patient's Ct returned with periapical abscess. Clinical concern remains for ludwigs  11:47 AM BP 120/70  Pulse 70  Temp(Src) 98.6 F (37 C) (Oral)  Resp 16  Ht 6' 2.5" (1.892 m)  Wt 250 lb (113.399 kg)  BMI 31.68 kg/m2  SpO2 99% Patient seen in shared visit with attending physician. Patient accepted by Dr Jerral Ralph. Concern for ludwigs angina Will need obs admission and close attention. He will be [facility] to [facility]. clinda q6 H Dr. Jenne Pane will consult on the patient.  Pt stable in ED with no significant deterioration in condition.   Arthor Captain, PA-C 12/18/13 2154  Arthor Captain, PA-C 12/22/13 [phone]

## 2013-12-18 NOTE — H&P (Signed)
PATIENT DETAILS Name: Bradley Hanson Age: 23 y.o. Sex: male Date of Birth: 23-Dec-1990 Admit Date: 12/18/2013 ZOX:WRUEAV, Chrissie Noa, MD  CHIEF COMPLAINT:  Right lower tooth pain, right submandibular swelling for 5 days  HPI: Bradley Hanson is a 23 y.o. male with noPast Medical History who presents today with the above noted complaint. Per patient, he has a known right mandibular tooth 30 cavity- for the past 5 days, he claims that he has had worsening pain in this area, and has developed swelling. Yesterday, he claims that the pain was significantly worse than the past 2 days. He claims that the swelling is now extending down to his submandibular and submental area. He was unable to sleep yesterday because of pain. He denies any fever. Denies any drooling, is easily able to swallow his saliva. No shortness of breath, no stridor. He was evaluated in the emergency room, where further workup revealed leukocytosis, a CT scan demonstrated a right mandibular tooth 30 abscess. ENT was consulted by the emergency department physician assistant, I was asked to admit this patient for further evaluation and treatment.  ALLERGIES:  No Known Allergies  PAST MEDICAL HISTORY: Past Medical History  Diagnosis Date  . Migraine   . Anxiety   . Pilonidal cyst   . Depression   . Pilonidal cyst with abscess 03/20/2011    PAST SURGICAL HISTORY: Past Surgical History  Procedure Laterality Date  . Cholecystectomy  2006  . Incise and drain abcess      pilo cyst    MEDICATIONS AT HOME: Prior to Admission medications   Medication Sig Start Date End Date Taking? Authorizing Provider  clonazePAM (KLONOPIN) 1 MG tablet Take 1 mg by mouth 3 (three) times daily as needed for anxiety.    Yes Historical Provider, MD  Multiple Vitamin (MULTIVITAMIN WITH MINERALS) TABS tablet Take 1 tablet by mouth daily.   Yes Historical Provider, MD  oxyCODONE-acetaminophen (PERCOCET) 10-325 MG per tablet Take 1 tablet by mouth  every 4 (four) hours as needed for pain.    Yes Historical Provider, MD    FAMILY HISTORY: Family History  Problem Relation Age of Onset  . Heart disease Paternal Grandmother     SOCIAL HISTORY:  reports that he has been smoking Cigarettes.  He has been smoking about 0.25 packs per day. He does not have any smokeless tobacco history on file. He reports that he drinks alcohol. He reports that he does not use illicit drugs.  REVIEW OF SYSTEMS:  Constitutional:   No  weight loss, night sweats,  Fevers, chills, fatigue.  HEENT:    No headaches, no runny nose  Cardio-vascular: No chest pain,  Orthopnea, PND, swelling in lower extremities, anasarca, dizziness, palpitations  GI:  No heartburn, indigestion, abdominal pain, nausea, vomiting, diarrhea, change in bowel habits, loss of appetite  Resp: No shortness of breath with exertion or at rest.  No excess mucus, no productive cough, No non-productive cough,  No coughing up of blood.No change in color of mucus.No wheezing.No chest wall deformity  Skin:  no rash or lesions.  GU:  no dysuria, change in color of urine, no urgency or frequency.  No flank pain.  Musculoskeletal: No joint pain or swelling.  No decreased range of motion.  No back pain.  Psych: No change in mood or affect. No depression or anxiety.  No memory loss.   PHYSICAL EXAM: Blood pressure 120/70, pulse 70, temperature 98.6 F (37 C), temperature source Oral, resp.  rate 16, height 6' 2.5" (1.892 m), weight 113.399 kg (250 lb), SpO2 99.00%.  General appearance :Awake, alert, in moderate distress due to pain, Speech Clear. Not toxic Looking HEENT: Atraumatic and Normocephalic, pupils equally reactive to light and accomodation. Mild trismus present, mild swelling evident on the right submandibular and submental area. No stridor. No drooling evident. Neck: supple, no JVD. No cervical lymphadenopathy.  Chest:Good air entry bilaterally, no added sounds  CVS: S1 S2  regular, no murmurs.  Abdomen: Bowel sounds present, Non tender and not distended with no gaurding, rigidity or rebound. Extremities: B/L Lower Ext shows no edema, both legs are warm to touch Neurology: Awake alert, and oriented X 3, CN II-XII intact, Non focal Skin:No Rash Wounds:N/A  LABS ON ADMISSION:   Recent Labs  12/18/13 0906  NA 138  K 4.4  CL 104  GLUCOSE 103*  BUN 12  CREATININE 1.40*   No results found for this basename: AST, ALT, ALKPHOS, BILITOT, PROT, ALBUMIN,  in the last 72 hours No results found for this basename: LIPASE, AMYLASE,  in the last 72 hours  Recent Labs  12/18/13 0859 12/18/13 0906  WBC 12.8*  --   HGB 14.7 16.7  HCT 45.2 49.0  MCV 92.2  --   PLT 340  --    No results found for this basename: CKTOTAL, CKMB, CKMBINDEX, TROPONINI,  in the last 72 hours No results found for this basename: DDIMER,  in the last 72 hours No components found with this basename: POCBNP,    RADIOLOGIC STUDIES ON ADMISSION: Ct Soft Tissue Neck W Contrast  12/18/2013   CLINICAL DATA:  Dental pain and swelling under tongue. Trismus and voice change. Pain right side internal.  EXAM: CT NECK WITH CONTRAST  TECHNIQUE: Multidetector CT imaging of the neck was performed using the standard protocol following the bolus administration of intravenous contrast.  CONTRAST:  75mL OMNIPAQUE IOHEXOL 300 MG/ML  SOLN  COMPARISON:  None.  FINDINGS: There are periapical lucencies about the right mandibular tooth number 30. Extending medially from the periapical lucencies is a bony sinus tract extending through the medial cortex of the mandible.  At the level is bony sinus tract, there is soft tissue thickening with central low density about the right aspect of the mandible consistent with a periapical abscess. This results in some mass effect on the floor of the mouth. No discrete rim enhancement is seen at this time. Periapical abscess measures approximately 4.0 cm AP diameter by 2.2 cm  transverse diameter along the medial aspect of the mandible. There is also some soft tissue inflammation/thickening lateral to the mandible at this level.  There are multiple dental caries, involving at least the following teeth:  Maxilla:  Teeth numbers 2, 8, and 9  Mandible: Teeth numbers 28, 29, 30. There may be a small caries involving teeth 24 and 25.  Imaged portion of the brain is unremarkable.  There are reactive sized submental lymph nodes.  Negative for a of prevertebral edema.  IMPRESSION: 1. Periapical lucencies about mandibular tooth #30 with an associated bony sinus tract and adjacent periapical abscess.  2. Multiple dental caries.   Electronically Signed   By: Britta Mccreedy M.D.   On: 12/18/2013 11:03     ASSESSMENT AND PLAN: Present on Admission:  . Mandibular abscess/Right mandibular teeth #30 abscess - Start clindamycin, no oral surgeon/dental surgeon on call over the weekend, await ENT evaluation. Monitor closely, if patient rapidly improves, suspect can be discharged with quick  outpatient followup by primary dentist for extraction, otherwise will need inpatient-dentistry/oral surgeon evaluation. Currently no stridor, or drooling evident at this time. Plan was discussed with patient, and mother and they were in agreement.   Further plan will depend as patient's clinical course evolves and further radiologic and laboratory data become available. Patient will be monitored closely.  Above noted plan was discussed with patient/family, they were in agreement.   DVT Prophylaxis: Prophylactic  Heparin  Code Status: Full Code  Total time spent for admission equals 45 minutes.  [facility] Pager [phone]  If 7PM-7AM, please contact night-coverage [email or web address] Password [facility] 12/18/2013, 12:29 PM  **Disclaimer: This note may have been dictated with voice recognition software. Similar sounding words can inadvertently be transcribed and this note may  contain transcription errors which may not have been corrected upon publication of note.**

## 2013-12-18 NOTE — ED Notes (Signed)
Pt. Stated, i started having toothach about 5 days ago., now my jaw hurts and my whole face.

## 2013-12-19 ENCOUNTER — Inpatient Hospital Stay (HOSPITAL_COMMUNITY): Payer: Self-pay

## 2013-12-19 DIAGNOSIS — M272 Inflammatory conditions of jaws: Secondary | ICD-10-CM

## 2013-12-19 LAB — CBC
HCT: 40 % (ref 39.0–52.0)
Hemoglobin: 12.9 g/dL — ABNORMAL LOW (ref 13.0–17.0)
MCH: 30 pg (ref 26.0–34.0)
MCHC: 32.3 g/dL (ref 30.0–36.0)
MCV: 93 fL (ref 78.0–100.0)
Platelets: 224 10*3/uL (ref 150–400)
RBC: 4.3 MIL/uL (ref 4.22–5.81)
RDW: 13.4 % (ref 11.5–15.5)
WBC: 7.8 10*3/uL (ref 4.0–10.5)

## 2013-12-19 LAB — BASIC METABOLIC PANEL
Anion gap: 11 (ref 5–15)
BUN: 5 mg/dL — AB (ref 6–23)
CALCIUM: 9 mg/dL (ref 8.4–10.5)
CO2: 25 mEq/L (ref 19–32)
Chloride: 102 mEq/L (ref 96–112)
Creatinine, Ser: 1.08 mg/dL (ref 0.50–1.35)
GFR calc Af Amer: >90 mL/min (ref >90)
GLUCOSE: 100 mg/dL — AB (ref 70–99)
Potassium: 4.6 mEq/L (ref 3.7–5.3)
Sodium: 138 mEq/L (ref 137–147)

## 2013-12-19 NOTE — Progress Notes (Signed)
Utilization review completed.  

## 2013-12-19 NOTE — Progress Notes (Signed)
Subjective: Doing well.  He feels that pain is a bit improved and swelling may be slight better.  Certainly, no worsening and no airway problems.  Objective: Vital signs in last 24 hours: Temp:  [97.4 F (36.3 C)-98.8 F (37.1 C)] 98.6 F (37 C) (09/13 0350) Pulse Rate:  [70-104] 96 (09/13 0350) Resp:  [12-18] 16 (09/13 0350) BP: (117-142)/(67-84) 123/73 mmHg (09/13 0350) SpO2:  [95 %-100 %] 95 % (09/13 0350) Weight:  [113.399 kg (250 lb)-120.5 kg (265 lb 10.5 oz)] 120.5 kg (265 lb 10.5 oz) (09/12 1353)    Intake/Output from previous day: 09/12 0701 - 09/13 0700 In: 1896.7 [P.O.:480; I.V.:1416.7] Out: 0  Intake/Output this shift: Total I/O In: 780 [P.O.:480; I.V.:300] Out: 0   General appearance: alert, cooperative and no distress Throat: floor of mouth soft edema unchanged, right perimandibular firm edema unchanged Neck: submental edema and tenderness a bit improved, no firmness or fluctuance to submental area  Lab Results:   Recent Labs  12/18/13 1441 12/19/13 0504  WBC 11.3* 7.8  HGB 14.1 12.9*  HCT 43.0 40.0  PLT 242 224   BMET  Recent Labs  12/18/13 0906 12/18/13 1441  NA 138  --   K 4.4  --   CL 104  --   GLUCOSE 103*  --   BUN 12  --   CREATININE 1.40* 1.03   PT/INR No results found for this basename: LABPROT, INR,  in the last 72 hours ABG No results found for this basename: PHART, PCO2, PO2, HCO3,  in the last 72 hours  Studies/Results: Ct Soft Tissue Neck W Contrast  12/18/2013   CLINICAL DATA:  Dental pain and swelling under tongue. Trismus and voice change. Pain right side internal.  EXAM: CT NECK WITH CONTRAST  TECHNIQUE: Multidetector CT imaging of the neck was performed using the standard protocol following the bolus administration of intravenous contrast.  CONTRAST:  75mL OMNIPAQUE IOHEXOL 300 MG/ML  SOLN  COMPARISON:  None.  FINDINGS: There are periapical lucencies about the right mandibular tooth number 30. Extending medially from the  periapical lucencies is a bony sinus tract extending through the medial cortex of the mandible.  At the level is bony sinus tract, there is soft tissue thickening with central low density about the right aspect of the mandible consistent with a periapical abscess. This results in some mass effect on the floor of the mouth. No discrete rim enhancement is seen at this time. Periapical abscess measures approximately 4.0 cm AP diameter by 2.2 cm transverse diameter along the medial aspect of the mandible. There is also some soft tissue inflammation/thickening lateral to the mandible at this level.  There are multiple dental caries, involving at least the following teeth:  Maxilla:  Teeth numbers 2, 8, and 9  Mandible: Teeth numbers 28, 29, 30. There may be a small caries involving teeth 24 and 25.  Imaged portion of the brain is unremarkable.  There are reactive sized submental lymph nodes.  Negative for a of prevertebral edema.  IMPRESSION: 1. Periapical lucencies about mandibular tooth #30 with an associated bony sinus tract and adjacent periapical abscess.  2. Multiple dental caries.   Electronically Signed   By: Britta Mccreedy M.D.   On: 12/18/2013 11:03    Anti-infectives: Anti-infectives   Start     Dose/Rate Route Frequency Ordered Stop   12/18/13 1200  clindamycin (CLEOCIN) IVPB 600 mg     600 mg 100 mL/hr over 30 Minutes Intravenous 4 times per  day 12/18/13 1147     12/18/13 1130  clindamycin (CLEOCIN) IVPB 600 mg     600 mg 100 mL/hr over 30 Minutes Intravenous  Once 12/18/13 1118 12/18/13 1223      Assessment/Plan: Right mandibular first molar infection with associated soft tissue infection involving submental zone and floor of mouth, stable Floor of mouth and submental edema no worse, perhaps slightly improved.  No airway symptoms.  Continue clindamycin.  He can probably be transferred to a regular room.  Oral surgery consultation as soon as one is available (Monday).  LOS: 1 day     Natosha Bou 12/19/2013

## 2013-12-19 NOTE — Progress Notes (Signed)
[facility] PROGRESS NOTE  Sankalp Ferrell ZOX:[id number] DOB: 10/19/1990 DOA: 12/18/2013 PCP: Johny Blamer, MD  Assessment/Plan:  Principal Problem:   Mandibular abscess with facial cellulitis: airway clear. Continue clindamycin. Transfer to floor. Consult oral surgeon tomorrow   Code Status:  full Family Communication:  Father at bedside Disposition Plan:  home  Consultants:  ENT  Procedures:     Antibiotics:  clinda  HPI/Subjective: Less swelling. No drainage. Swallowing full liquids without difficulty. No dyspnea  Objective: Filed Vitals:   12/19/13 1227  BP: 129/77  Pulse: 110  Temp: 98.7 F (37.1 C)  Resp: 18    Intake/Output Summary (Last 24 hours) at 12/19/13 1424 Last data filed at 12/19/13 1300  Gross per 24 hour  Intake 3546.67 ml  Output   4200 ml  Net -653.33 ml   Filed Weights   12/18/13 0821 12/18/13 1353  Weight: 113.399 kg (250 lb) 120.5 kg (265 lb 10.5 oz)    Exam:   General:  Nontoxic. Comfortable  HEENT:  Right face with swelling, submandibular area. Fractured and carried tooth  Cardiovascular: RRR without MGR  Respiratory:  CTA without WRR  Abdomen: S, NT, ND  Ext: no cCE  Basic Metabolic Panel:  Recent Labs Lab 12/18/13 0906 12/18/13 1441 12/19/13 0504  NA 138  --  138  K 4.4  --  4.6  CL 104  --  102  CO2  --   --  25  GLUCOSE 103*  --  100*  BUN 12  --  5*  CREATININE 1.40* 1.03 1.08  CALCIUM  --   --  9.0   Liver Function Tests: No results found for this basename: AST, ALT, ALKPHOS, BILITOT, PROT, ALBUMIN,  in the last 168 hours No results found for this basename: LIPASE, AMYLASE,  in the last 168 hours No results found for this basename: AMMONIA,  in the last 168 hours CBC:  Recent Labs Lab 12/18/13 0859 12/18/13 0906 12/18/13 1441 12/19/13 0504  WBC 12.8*  --  11.3* 7.8  HGB 14.7 16.7 14.1 12.9*  HCT 45.2 49.0 43.0 40.0  MCV 92.2  --  91.3 93.0  PLT 340  --  242 224   Cardiac  Enzymes: No results found for this basename: CKTOTAL, CKMB, CKMBINDEX, TROPONINI,  in the last 168 hours BNP (last 3 results) No results found for this basename: PROBNP,  in the last 8760 hours CBG: No results found for this basename: GLUCAP,  in the last 168 hours  Recent Results (from the past 240 hour(s))  MRSA PCR SCREENING     Status: None   Collection Time    12/18/13  2:01 PM      Result Value Ref Range Status   MRSA by PCR NEGATIVE  NEGATIVE Final   Comment:            The GeneXpert MRSA Assay (FDA     approved for NASAL specimens     only), is one component of a     comprehensive MRSA colonization     surveillance program. It is not     intended to diagnose MRSA     infection nor to guide or     monitor treatment for     MRSA infections.     Studies: Ct Soft Tissue Neck W Contrast  12/18/2013   CLINICAL DATA:  Dental pain and swelling under tongue. Trismus and voice change. Pain right side internal.  EXAM: CT NECK WITH CONTRAST  TECHNIQUE: Multidetector  CT imaging of the neck was performed using the standard protocol following the bolus administration of intravenous contrast.  CONTRAST:  75mL OMNIPAQUE IOHEXOL 300 MG/ML  SOLN  COMPARISON:  None.  FINDINGS: There are periapical lucencies about the right mandibular tooth number 30. Extending medially from the periapical lucencies is a bony sinus tract extending through the medial cortex of the mandible.  At the level is bony sinus tract, there is soft tissue thickening with central low density about the right aspect of the mandible consistent with a periapical abscess. This results in some mass effect on the floor of the mouth. No discrete rim enhancement is seen at this time. Periapical abscess measures approximately 4.0 cm AP diameter by 2.2 cm transverse diameter along the medial aspect of the mandible. There is also some soft tissue inflammation/thickening lateral to the mandible at this level.  There are multiple dental caries,  involving at least the following teeth:  Maxilla:  Teeth numbers 2, 8, and 9  Mandible: Teeth numbers 28, 29, 30. There may be a small caries involving teeth 24 and 25.  Imaged portion of the brain is unremarkable.  There are reactive sized submental lymph nodes.  Negative for a of prevertebral edema.  IMPRESSION: 1. Periapical lucencies about mandibular tooth #30 with an associated bony sinus tract and adjacent periapical abscess.  2. Multiple dental caries.   Electronically Signed   By: Britta Mccreedy M.D.   On: 12/18/2013 11:03    Scheduled Meds: . clindamycin (CLEOCIN) IV  600 mg Intravenous 4 times per day  . heparin  5,000 Units Subcutaneous 3 times per day  . pneumococcal 23 valent vaccine  0.5 mL Intramuscular Tomorrow-1000   Continuous Infusions: . sodium chloride 100 mL/hr at 12/19/13 0400    Time spent: 25 minutes  Linard Daft L  [facility] Pager [phone]. If 7PM-7AM, please contact night-coverage at [email or web address], password [facility] 12/19/2013, 2:24 PM  LOS: 1 day

## 2013-12-20 DIAGNOSIS — K044 Acute apical periodontitis of pulpal origin: Secondary | ICD-10-CM

## 2013-12-20 NOTE — Progress Notes (Signed)
  Subjective: He is feeling much better with return of normal speech.  Objective: Vital signs in last 24 hours: Temp:  [97.8 F (36.6 C)-99.3 F (37.4 C)] 97.8 F (36.6 C) (09/14 1610) Pulse Rate:  [87-95] 87 (09/14 0638) Resp:  [16-17] 17 (09/14 9604) BP: (120-148)/(76-84) 120/78 mmHg (09/14 0638) SpO2:  [94 %-98 %] 98 % (09/14 0638) Last BM Date: 12/18/13  Intake/Output from previous day: 09/13 0701 - 09/14 0700 In: 2550 [P.O.:1740; I.V.:710; IV Piggyback:100] Out: 1900 [Urine:1900] Intake/Output this shift: Total I/O In: 360 [P.O.:360] Out: 1650 [Urine:1650]  General appearance: alert, cooperative and no distress Throat: floor of mouth edema nearly fully resolved, right perimandibular firm edema remains present. Neck: submental edema and tenderness improved  Lab Results:   Recent Labs  12/18/13 1441 12/19/13 0504  WBC 11.3* 7.8  HGB 14.1 12.9*  HCT 43.0 40.0  PLT 242 224   BMET  Recent Labs  12/18/13 0906 12/18/13 1441 12/19/13 0504  NA 138  --  138  K 4.4  --  4.6  CL 104  --  102  CO2  --   --  25  GLUCOSE 103*  --  100*  BUN 12  --  5*  CREATININE 1.40* 1.03 1.08  CALCIUM  --   --  9.0   PT/INR No results found for this basename: LABPROT, INR,  in the last 72 hours ABG No results found for this basename: PHART, PCO2, PO2, HCO3,  in the last 72 hours  Studies/Results: Dg Orthopantogram  12/19/2013   CLINICAL DATA:  Right lower dental abscess.  EXAM: ORTHOPANTOGRAM/PANORAMIC  COMPARISON:  None.  FINDINGS: Right lower second molar periapical lucency is identified concerning for small abscess. A second periapical lucency is identified at the right lower second bicuspid. This is somewhat obscured by motion.  IMPRESSION: 1. Right lower second molar periapical abscess. 2. Equivocal lucency around the right lower second bicuspid which may also represent a small periapical abscess.   Electronically Signed   By: Signa Kell M.D.   On: 12/19/2013 19:03     Anti-infectives: Anti-infectives   Start     Dose/Rate Route Frequency Ordered Stop   12/18/13 1200  clindamycin (CLEOCIN) IVPB 600 mg     600 mg 100 mL/hr over 30 Minutes Intravenous 4 times per day 12/18/13 1147     12/18/13 1130  clindamycin (CLEOCIN) IVPB 600 mg     600 mg 100 mL/hr over 30 Minutes Intravenous  Once 12/18/13 1118 12/18/13 1223      Assessment/Plan: Right first mandibular molar infection with associated submental and floor of mouth soft tissue infection Soft tissue edema much improved.  Right first mandibular molar remains infected with perimandibular subperiosteal infection.  Consult oral surgery.  Continue IV antibiotics.  Will sign off.  Call with additional concerns.  LOS: 2 days    Bradley Hanson 12/20/2013

## 2013-12-20 NOTE — Progress Notes (Signed)
[facility] TEAM 1 - Stepdown/ICU TEAM Progress Note  Bradley Hanson ZOX:[id number] DOB: 10/10/1990 DOA: 12/18/2013 PCP: Johny Blamer, MD  Admit HPI / Brief Narrative: 23 y.o. male with no signif medical history who presented with R lower tooth pain w/ associated submandibular swelling. Per patient, he has a known right mandibular tooth 30 cavity for 5 days had worsening pain in this area with new swelling. He was evaluated in the emergency room, where further workup revealed leukocytosis, and a CT scan demonstrated a right mandibular tooth 30 abscess. ENT was consulted by the emergency department physician assistant.  HPI/Subjective: Pt is somewhat sedated on pain medication.  Reports ongoing pain in mouth.  Denies sob, n/v, abdom pain, or trouble swallowing/breathing.    Assessment/Plan:  Right mandibular tooth #30 abscess with facial cellulitis/submental and floor of mouth soft tissue infection Cont IV abx - ENT signing off -  Call placed to dental medicine clinic w/ message for consult left on machine - pt has no dental insurance and therefore no access to outside dental care   Code Status: FULL Family Communication: spoke w/ mother at bedside  Disposition Plan: med bed - intpt dental extraction v/s inpt IV abx until improved enough to allow d/c home on oral abx for possible outpt extraction (will be difficult due to no dental insurance)  Consultants: ENT Dental Medicine   Procedures: none  Antibiotics: Clindamycin 9/12 >  DVT prophylaxis: SQ heparin   Objective: Blood pressure 136/77, pulse 87, temperature 97.7 F (36.5 C), temperature source Oral, resp. rate 18, height  (1.88 m), weight 120.5 kg (265 lb 10.5 oz), SpO2 100.00%.  Intake/Output Summary (Last 24 hours) at 12/20/13 1439 Last data filed at 12/20/13 1224  Gross per 24 hour  Intake    960 ml  Output   1650 ml  Net   -690 ml   Exam: General: No acute respiratory distress HEENT:  Modest submandibular  swelling on R  Lungs: Clear to auscultation bilaterally without wheezes or crackles Cardiovascular: Regular rate and rhythm without murmur gallop or rub  Abdomen: Nontender, nondistended, soft, bowel sounds positive, no rebound, no ascites, no appreciable mass Extremities: No significant cyanosis, clubbing, or edema bilateral lower extremities  Data Reviewed: Basic Metabolic Panel:  Recent Labs Lab 12/18/13 0906 12/18/13 1441 12/19/13 0504  NA 138  --  138  K 4.4  --  4.6  CL 104  --  102  CO2  --   --  25  GLUCOSE 103*  --  100*  BUN 12  --  5*  CREATININE 1.40* 1.03 1.08  CALCIUM  --   --  9.0   Liver Function Tests: No results found for this basename: AST, ALT, ALKPHOS, BILITOT, PROT, ALBUMIN,  in the last 168 hours  CBC:  Recent Labs Lab 12/18/13 0859 12/18/13 0906 12/18/13 1441 12/19/13 0504  WBC 12.8*  --  11.3* 7.8  HGB 14.7 16.7 14.1 12.9*  HCT 45.2 49.0 43.0 40.0  MCV 92.2  --  91.3 93.0  PLT 340  --  242 224    Recent Results (from the past 240 hour(s))  MRSA PCR SCREENING     Status: None   Collection Time    12/18/13  2:01 PM      Result Value Ref Range Status   MRSA by PCR NEGATIVE  NEGATIVE Final   Comment:            The GeneXpert MRSA Assay (FDA  approved for NASAL specimens     only), is one component of a     comprehensive MRSA colonization     surveillance program. It is not     intended to diagnose MRSA     infection nor to guide or     monitor treatment for     MRSA infections.     Studies:  Recent x-ray studies have been reviewed in detail by the Attending Physician  Scheduled Meds:  Scheduled Meds: . clindamycin (CLEOCIN) IV  600 mg Intravenous 4 times per day  . heparin  5,000 Units Subcutaneous 3 times per day  . pneumococcal 23 valent vaccine  0.5 mL Intramuscular Tomorrow-1000    Time spent on care of this patient: 35 mins   Artemus Romanoff T , MD   [facility] Office  [phone] Pager - Text Page per  Loretha Stapler as per below:  On-Call/Text Page:      [email or web address]      password [email or web address]  If 7PM-7AM, please contact night-coverage [email or web address] Password [email or web address] 12/20/2013, 2:39 PM   LOS: 2 days

## 2013-12-21 ENCOUNTER — Encounter (HOSPITAL_COMMUNITY): Payer: Self-pay | Admitting: Dentistry

## 2013-12-21 DIAGNOSIS — K0401 Reversible pulpitis: Secondary | ICD-10-CM

## 2013-12-21 DIAGNOSIS — K045 Chronic apical periodontitis: Secondary | ICD-10-CM

## 2013-12-21 DIAGNOSIS — R220 Localized swelling, mass and lump, head: Secondary | ICD-10-CM

## 2013-12-21 DIAGNOSIS — R221 Localized swelling, mass and lump, neck: Secondary | ICD-10-CM

## 2013-12-21 LAB — CBC
HEMATOCRIT: 39.1 % (ref 39.0–52.0)
Hemoglobin: 12.7 g/dL — ABNORMAL LOW (ref 13.0–17.0)
MCH: 29.6 pg (ref 26.0–34.0)
MCHC: 32.5 g/dL (ref 30.0–36.0)
MCV: 91.1 fL (ref 78.0–100.0)
Platelets: 246 10*3/uL (ref 150–400)
RBC: 4.29 MIL/uL (ref 4.22–5.81)
RDW: 13.2 % (ref 11.5–15.5)
WBC: 4.5 10*3/uL (ref 4.0–10.5)

## 2013-12-21 LAB — MRSA PCR SCREENING: MRSA by PCR: NEGATIVE

## 2013-12-21 MED ORDER — OXYCODONE HCL 5 MG PO TABS
5.0000 mg | ORAL_TABLET | ORAL | Status: DC | PRN
Start: 2013-12-21 — End: 2013-12-23
  Administered 2013-12-21 – 2013-12-23 (×8): 5 mg via ORAL
  Filled 2013-12-21 (×9): qty 1

## 2013-12-21 MED ORDER — MORPHINE SULFATE 2 MG/ML IJ SOLN: 1.0000 mg | Freq: Four times a day (QID) | INTRAMUSCULAR | Status: DC | PRN

## 2013-12-21 MED ORDER — HEPARIN SODIUM (PORCINE) 5000 UNIT/ML IJ SOLN: 5000.0000 [IU] | Freq: Three times a day (TID) | INTRAMUSCULAR | Status: DC

## 2013-12-21 MED ORDER — MORPHINE SULFATE 2 MG/ML IJ SOLN
0.5000 mg | Freq: Four times a day (QID) | INTRAMUSCULAR | Status: DC | PRN
  Administered 2013-12-21 – 2013-12-22 (×4): 0.5 mg via INTRAVENOUS
  Filled 2013-12-21 (×4): qty 1

## 2013-12-21 NOTE — Progress Notes (Signed)
Patient ID: Bradley Hanson  male  ZOX:[id number]    DOB: May 23, 1990    DOA: 12/18/2013  PCP: Johny Blamer, MD  Admit HPI / Brief Narrative:  23 y.o. male with no signif medical history who presented with R lower tooth pain w/ associated submandibular swelling. Per patient, he has a known right mandibular tooth 30 cavity for 5 days had worsening pain in this area with new swelling. He was evaluated in the emergency room, where further workup revealed leukocytosis, and a CT scan demonstrated a right mandibular tooth 30 abscess. ENT was consulted by the emergency department physician assistant.   Assessment/Plan: Principal Problem:   Mandibular abscess #30 abscess with facial cellulitis/submental and floor of mouth soft tissue infection - Continue IV clindamycin - Decrease pain control, patient is appearing to have pain medication seeking behavior and requesting IV morphine around-the-clock - ENT, Dr Jenne Pane have signed off recommending oral dental surgery to be consulted - Dental surgery was consulted, patient was seen by Dr. Robin Searing, recommended inpatient dental extraction tomorrow 9/16 -N.p.o. after midnight     DVT Prophylaxis: Hold off on heparin, placed on SCDs  Code Status:  Family Communication:  Disposition:  Consultants:  ENT, Dr Jenne Pane  Oral dental surgery, Dr Kristin Bruins  Procedures:  None  Antibiotics:  IV clindamycin 9/12>>    Subjective: Patient seen and examined, facial cellulitis improving, dental pain improving  Objective: Weight change:   Intake/Output Summary (Last 24 hours) at 12/21/13 1110 Last data filed at 12/21/13 0608  Gross per 24 hour  Intake 1384.16 ml  Output   2350 ml  Net -965.84 ml   Blood pressure 111/71, pulse 56, temperature 97.3 F (36.3 C), temperature source Oral, resp. rate 16, height  (1.88 m), weight 120.5 kg (265 lb 10.5 oz), SpO2 99.00%.  Physical Exam: General: Alert and awake, oriented x3, not in any acute  distress. HEENT: Sub mandibular swelling on the right, improving  CVS: S1-S2 clear, no murmur rubs or gallops Chest: clear to auscultation bilaterally, no wheezing, rales or rhonchi Abdomen: soft nontender, nondistended, normal bowel sounds  Extremities: no cyanosis, clubbing or edema noted bilaterally Neuro: Cranial nerves II-XII intact, no focal neurological deficits  Lab Results: Basic Metabolic Panel:  Recent Labs Lab 12/18/13 0906 12/18/13 1441 12/19/13 0504  NA 138  --  138  K 4.4  --  4.6  CL 104  --  102  CO2  --   --  25  GLUCOSE 103*  --  100*  BUN 12  --  5*  CREATININE 1.40* 1.03 1.08  CALCIUM  --   --  9.0   Liver Function Tests: No results found for this basename: AST, ALT, ALKPHOS, BILITOT, PROT, ALBUMIN,  in the last 168 hours No results found for this basename: LIPASE, AMYLASE,  in the last 168 hours No results found for this basename: AMMONIA,  in the last 168 hours CBC:  Recent Labs Lab 12/19/13 0504 12/21/13 0415  WBC 7.8 4.5  HGB 12.9* 12.7*  HCT 40.0 39.1  MCV 93.0 91.1  PLT 224 246   Cardiac Enzymes: No results found for this basename: CKTOTAL, CKMB, CKMBINDEX, TROPONINI,  in the last 168 hours BNP: No components found with this basename: POCBNP,  CBG: No results found for this basename: GLUCAP,  in the last 168 hours   Micro Results: Recent Results (from the past 240 hour(s))  MRSA PCR SCREENING     Status: None   Collection Time  12/18/13  2:01 PM      Result Value Ref Range Status   MRSA by PCR NEGATIVE  NEGATIVE Final   Comment:            The GeneXpert MRSA Assay (FDA     approved for NASAL specimens     only), is one component of a     comprehensive MRSA colonization     surveillance program. It is not     intended to diagnose MRSA     infection nor to guide or     monitor treatment for     MRSA infections.    Studies/Results: Dg Orthopantogram  12/19/2013   CLINICAL DATA:  Right lower dental abscess.  EXAM:  ORTHOPANTOGRAM/PANORAMIC  COMPARISON:  None.  FINDINGS: Right lower second molar periapical lucency is identified concerning for small abscess. A second periapical lucency is identified at the right lower second bicuspid. This is somewhat obscured by motion.  IMPRESSION: 1. Right lower second molar periapical abscess. 2. Equivocal lucency around the right lower second bicuspid which may also represent a small periapical abscess.   Electronically Signed   By: Signa Kell M.D.   On: 12/19/2013 19:03   Ct Soft Tissue Neck W Contrast  12/18/2013   CLINICAL DATA:  Dental pain and swelling under tongue. Trismus and voice change. Pain right side internal.  EXAM: CT NECK WITH CONTRAST  TECHNIQUE: Multidetector CT imaging of the neck was performed using the standard protocol following the bolus administration of intravenous contrast.  CONTRAST:  75mL OMNIPAQUE IOHEXOL 300 MG/ML  SOLN  COMPARISON:  None.  FINDINGS: There are periapical lucencies about the right mandibular tooth number 30. Extending medially from the periapical lucencies is a bony sinus tract extending through the medial cortex of the mandible.  At the level is bony sinus tract, there is soft tissue thickening with central low density about the right aspect of the mandible consistent with a periapical abscess. This results in some mass effect on the floor of the mouth. No discrete rim enhancement is seen at this time. Periapical abscess measures approximately 4.0 cm AP diameter by 2.2 cm transverse diameter along the medial aspect of the mandible. There is also some soft tissue inflammation/thickening lateral to the mandible at this level.  There are multiple dental caries, involving at least the following teeth:  Maxilla:  Teeth numbers 2, 8, and 9  Mandible: Teeth numbers 28, 29, 30. There may be a small caries involving teeth 24 and 25.  Imaged portion of the brain is unremarkable.  There are reactive sized submental lymph nodes.  Negative for a of  prevertebral edema.  IMPRESSION: 1. Periapical lucencies about mandibular tooth #30 with an associated bony sinus tract and adjacent periapical abscess.  2. Multiple dental caries.   Electronically Signed   By: Britta Mccreedy M.D.   On: 12/18/2013 11:03    Medications: Scheduled Meds: . clindamycin (CLEOCIN) IV  600 mg Intravenous 4 times per day  . heparin  5,000 Units Subcutaneous 3 times per day  . pneumococcal 23 valent vaccine  0.5 mL Intramuscular Tomorrow-1000      LOS: 3 days   Orazio Weller M.D. [facility] 12/21/2013, 11:10 AM Pager: [phone]  If 7PM-7AM, please contact night-coverage [email or web address] Password [email or web address]  **Disclaimer: This note was dictated with voice recognition software. Similar sounding words can inadvertently be transcribed and this note may contain transcription errors which may not have been corrected upon publication of note.**

## 2013-12-21 NOTE — Consult Note (Signed)
DENTAL CONSULTATION  Date of Consultation:  12/21/2013 Patient Name:   Bradley Hanson Date of Birth:   Jun 30, 1990 Medical Record Number: [id number]  VITALS: BP 111/71  Pulse 56  Temp(Src) 97.3 F (36.3 C) (Oral)  Resp 16  Ht  (1.88 m)  Wt 265 lb 10.5 oz (120.5 kg)  BMI 34.09 kg/m2  SpO2 99%   CHIEF COMPLAINT: Patient referred for evaluation of right mandibular abscess and facial swelling .  HPI: Bradley Hanson there is a 23 year old male recently diagnosed with right facial swelling and dental abscess. Patient was admitted on Saturday, 12/18/2013 and placed on IV antibiotic therapy. Patient found to have periapical abscess associated with tooth #30. Patient was referred to dental medicine for evaluation and treatment as indicated.  Patient currently complaining of tooth pain involving #30. Patient describes sharp pain at a 7/10 intensity. Patient indicates that pain reached a previous intensity of 8/10. Pain has been relieved with IV antibiotics and pain medication. Patient has a history of having a cavity present for the past 6-8 month. Patient recently started experiencing toothache symptoms lasting for minutes to hours at a time over the past 4-5 month. The pain intensified approximately one week ago. Patient subsequently developed swelling 5 days ago that required him to present to the emergency room on Saturday, 12/18/2013. Patient was admitted and is now being evaluated as above.   The patient last saw a dentist approximately one year ago to have a filling placed. Patient saw Dr. Jiles Harold in [place], [place].  Patient is not seen on a regular basis and only goes to the dentist when he needs to. Patient knows that he has other dental needs but is unable to afford dental treatment at this time. Patient is interested in having the tooth extracted during this admission if possible due to the economic constraints.   PROBLEM LIST: Patient Active Problem List   Diagnosis Date  Noted  . Mandibular abscess 12/18/2013  . Pilonidal cyst with abscess 03/20/2011    PMH: Past Medical History  Diagnosis Date  . Migraine   . Anxiety   . Pilonidal cyst   . Depression   . Pilonidal cyst with abscess 03/20/2011    PSH: Past Surgical History  Procedure Laterality Date  . Cholecystectomy  2006  . Incise and drain abcess      pilo cyst    ALLERGIES: No Known Allergies  MEDICATIONS: Current Facility-Administered Medications  Medication Dose Route Frequency Provider Last Rate Last Dose  . 0.9 %  sodium chloride infusion   Intravenous Continuous Christiane Ha, MD 10 mL/hr at 12/20/13 2001    . acetaminophen (TYLENOL) tablet 650 mg  650 mg Oral Q6H PRN Shanker Levora Dredge, MD       Or  . acetaminophen (TYLENOL) suppository 650 mg  650 mg Rectal Q6H PRN Shanker Levora Dredge, MD      . albuterol (PROVENTIL) (2.5 MG/3ML) 0.083% nebulizer solution 2.5 mg  2.5 mg Nebulization Q2H PRN Shanker Levora Dredge, MD      . clindamycin (CLEOCIN) IVPB 600 mg  600 mg Intravenous 4 times per day Arthor Captain, PA-C 100 mL/hr at 12/21/13 0608 600 mg at 12/21/13 5284  . clonazePAM (KLONOPIN) tablet 1 mg  1 mg Oral TID PRN Rolan Lipa, NP   1 mg at 12/20/13 2224  . guaiFENesin-dextromethorphan (ROBITUSSIN DM) 100-10 MG/5ML syrup 5 mL  5 mL Oral Q4H PRN Shanker Levora Dredge, MD      .  heparin injection 5,000 Units  5,000 Units Subcutaneous 3 times per day Maretta Bees, MD   5,000 Units at 12/21/13 [phone]  . morphine 2 MG/ML injection 1-2 mg  1-2 mg Intravenous Q4H PRN Maretta Bees, MD   2 mg at 12/20/13 2122  . ondansetron (ZOFRAN) tablet 4 mg  4 mg Oral Q6H PRN Shanker Levora Dredge, MD       Or  . ondansetron [facility]) injection 4 mg  4 mg Intravenous Q6H PRN Maretta Bees, MD      . oxyCODONE (Oxy IR/ROXICODONE) immediate release tablet 5-10 mg  5-10 mg Oral Q4H PRN Rolan Lipa, NP   10 mg at 12/21/13 0338  . pneumococcal 23 valent vaccine (PNU-IMMUNE)  injection 0.5 mL  0.5 mL Intramuscular Tomorrow-1000 Maretta Bees, MD        LABS: Lab Results  Component Value Date   WBC 4.5 12/21/2013   HGB 12.7* 12/21/2013   HCT 39.1 12/21/2013   MCV 91.1 12/21/2013   PLT 246 12/21/2013      Component Value Date/Time   NA 138 12/19/2013 0504   K 4.6 12/19/2013 0504   CL 102 12/19/2013 0504   CO2 25 12/19/2013 0504   GLUCOSE 100* 12/19/2013 0504   BUN 5* 12/19/2013 0504   CREATININE 1.08 12/19/2013 0504   CALCIUM 9.0 12/19/2013 0504   GFRNONAA >90 12/19/2013 0504   GFRAA >90 12/19/2013 0504   No results found for this basename: INR, PROTIME   No results found for this basename: PTT    SOCIAL HISTORY: History   Social History  . Marital Status: Single    Spouse Name: N/A    Number of Children: N/A  . Years of Education: N/A   Occupational History  . Not on file.   Social History Main Topics  . Smoking status: Current Every Day Smoker -- 0.25 packs/day    Types: Cigarettes  . Smokeless tobacco: Not on file  . Alcohol Use: Yes  . Drug Use: No  . Sexual Activity:    Other Topics Concern  . Not on file   Social History Narrative  . No narrative on file    FAMILY HISTORY: Family History  Problem Relation Age of Onset  . Heart disease Paternal Grandmother      REVIEW OF SYSTEMS: Reviewed from chart for this admission.  DENTAL HISTORY: CHIEF COMPLAINT: Patient referred for evaluation of right mandibular abscess and facial swelling .  HPI: Bradley Hanson there is a 24 year old male recently diagnosed with right facial swelling and dental abscess. Patient was admitted on Saturday, 12/18/2013 and placed on IV antibiotic therapy. Patient found to have periapical abscess associated with tooth #30. Patient was referred to dental medicine for evaluation and treatment as indicated.  Patient currently complaining of tooth pain involving #30. Patient describes sharp pain at a 7/10 intensity. Patient indicates that pain reached a  previous intensity of 8/10. Pain has been relieved with IV antibiotics and pain medication. Patient has a history of having a cavity present for the past 6-8 month. Patient recently started experiencing toothache symptoms lasting for minutes to hours at a time over the past 4-5 month. The pain intensified approximately one week ago. Patient subsequently developed swelling 5 days ago that required him to present to the emergency room on Saturday, 12/18/2013. Patient was admitted and is now being evaluated as above.   The patient last saw a dentist approximately one year ago to have a filling placed.  Patient saw Dr. Jiles Harold in [place], [place].  Patient is not seen on a regular basis and only goes to the dentist when he needs to. Patient knows that he has other dental needs but is unable to afford dental treatment at this time. Patient is interested in having the tooth extracted during this admission if possible due to the economic constraints.   DENTAL EXAMINATION: GENERAL: The patient is a well-developed, well-nourished male in no acute distress. HEAD AND NECK: Patient has right neck lymphadenopathy. No significant facial swelling is noted at this time. INTRAORAL EXAM: Patient has normal saliva. There is some evidence of buccal swelling in the area of #30. Maximum interincisal opening is limited but is approximately 25 mm today. DENTITION: Patient is missing tooth numbers 1, 16, 17, and 32. PERIODONTAL: Patient has chronic periodontitis with plaque and calculus accumulations, selective areas of gingival recession, and incipient bone loss. DENTAL CARIES/SUBOPTIMAL RESTORATIONS: Patient has multiple dental caries affecting multiple teeth. Patient will need a full series of dental radiographs to identify the full extent of dental caries by the primary dentist of his choice upon discharge. Extensive dental caries associated with tooth #30 with probable pulpal exposure. Tooth #9 is fractured to  the gum line secondary to trauma approximately one year ago, but patient denies acute pulpitis symptoms. ENDODONTIC: Patient is complaining of acute pulpitis symptoms involving tooth #30. There is periapical pathology and radiolucency associated with the apex of tooth #30. The subtle radiolucency noted on the orthopantogram in the premolar region most likely represents the mental nerve foramen as the patient denies acute pulpitis symptoms of tooth numbers 28 and 29 today. CROWN AND BRIDGE: Patient has no crown or bridge restorations. Patient ideally would have tooth #9 evaluated for root canal therapy and crown placement in the future. PROSTHODONTIC: No partial dentures OCCLUSION: Patient has a poor occlusal scheme but a stable occlusion at this time.  RADIOGRAPHIC INTERPRETATION: An orthopantogram was taken on 12/19/2013. The patient is missing tooth numbers 1, 16, 17, and 32. There are extensive dental caries associated with tooth #30. Tooth #30 has a periapical radiolucency associated with the apices. There is a radiolucency between tooth numbers 28 and 29 and most likely represents the mental nerve foramen. Patient has had previous root canal therapy associated with tooth #6. There is incipient bone loss. Tooth #9 appears to be fractured near the gum line.  ASSESSMENTS: 1. History of right facial swelling and buccal abscess-resolving 2. 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 3. History of acute pulpitis symptoms 4. Chronic periodontitis with bone loss 5. Accretions 6. Selective areas of gingival recession 7. Fractured tooth #9 the gum line secondary to trauma approximetly one year ago. 8. Multiple dental caries 9. History of oral neglect secondary to financial constraints   PLAN/RECOMMENDATIONS: 1. I discussed the risks, benefits, and complications of various treatment options with the patient in relationship to the medical and dental conditions. We discussed various treatment options to include  no treatment, multiple extractions with alveoloplasty, pre-prosthetic surgery as indicated, periodontal therapy, dental restorations, root canal therapy, crown and bridge therapy, implant therapy, and replacement of missing teeth as indicated. The patient currently wishes to proceed with extraction of tooth #30 with alveoloplasty and gross debridement of remaining dentition in the operating room with general anesthesia.  Patient refused to have tooth #9 extracted at this time and will followup with a primary dentist for root canal therapy and crown fabrication as indicated. Patient is aware of the need for dental exam,  dental radiographs, and a discussion of comprehensive dental treatment needs with the primary dentist of his choice. The patient currently is scheduled for an operating room procedure Wednesday, 12/22/2013 at 10:30 AM. The patient will then be discharged as indicated.  2. Discussion of findings with medical team and coordination of future medical and dental care as needed.   Charlynne Pander, DDS

## 2013-12-22 ENCOUNTER — Encounter (HOSPITAL_COMMUNITY): Payer: Self-pay | Admitting: Anesthesiology

## 2013-12-22 ENCOUNTER — Encounter (HOSPITAL_COMMUNITY)
Admission: EM | Disposition: A | Discharge status: Home / Self Care | Payer: Self-pay | Source: Home / Self Care | Attending: Family Medicine

## 2013-12-22 ENCOUNTER — Inpatient Hospital Stay (HOSPITAL_COMMUNITY): Payer: Self-pay | Admitting: Anesthesiology

## 2013-12-22 DIAGNOSIS — K046 Periapical abscess with sinus: Secondary | ICD-10-CM

## 2013-12-22 DIAGNOSIS — K029 Dental caries, unspecified: Secondary | ICD-10-CM

## 2013-12-22 DIAGNOSIS — K045 Chronic apical periodontitis: Secondary | ICD-10-CM | POA: Diagnosis present

## 2013-12-22 DIAGNOSIS — K0530 Chronic periodontitis, unspecified: Secondary | ICD-10-CM | POA: Diagnosis present

## 2013-12-22 DIAGNOSIS — K047 Periapical abscess without sinus: Secondary | ICD-10-CM | POA: Diagnosis present

## 2013-12-22 HISTORY — PX: MULTIPLE EXTRACTIONS WITH ALVEOLOPLASTY: SHX5342

## 2013-12-22 SURGERY — MULTIPLE EXTRACTION WITH ALVEOLOPLASTY
Anesthesia: General

## 2013-12-22 MED ORDER — GLYCOPYRROLATE 0.2 MG/ML IJ SOLN
INTRAMUSCULAR | Status: DC | PRN
  Administered 2013-12-22: 0.6 mg via INTRAVENOUS

## 2013-12-22 MED ORDER — NEOSTIGMINE METHYLSULFATE 10 MG/10ML IV SOLN
INTRAVENOUS | Status: AC
  Filled 2013-12-22: qty 1

## 2013-12-22 MED ORDER — STERILE WATER FOR INJECTION IJ SOLN
INTRAMUSCULAR | Status: AC
  Filled 2013-12-22: qty 10

## 2013-12-22 MED ORDER — MIDAZOLAM HCL 5 MG/5ML IJ SOLN
INTRAMUSCULAR | Status: DC | PRN
  Administered 2013-12-22 (×2): 1 mg via INTRAVENOUS

## 2013-12-22 MED ORDER — FENTANYL CITRATE 0.05 MG/ML IJ SOLN
INTRAMUSCULAR | Status: DC | PRN
  Administered 2013-12-22: 100 ug via INTRAVENOUS
  Administered 2013-12-22 (×3): 50 ug via INTRAVENOUS

## 2013-12-22 MED ORDER — GLYCOPYRROLATE 0.2 MG/ML IJ SOLN
INTRAMUSCULAR | Status: AC
  Filled 2013-12-22: qty 3

## 2013-12-22 MED ORDER — LIDOCAINE HCL (CARDIAC) 20 MG/ML IV SOLN
INTRAVENOUS | Status: DC | PRN
  Administered 2013-12-22: 70 mg via INTRAVENOUS

## 2013-12-22 MED ORDER — LIDOCAINE HCL (CARDIAC) 20 MG/ML IV SOLN
INTRAVENOUS | Status: AC
  Filled 2013-12-22: qty 5

## 2013-12-22 MED ORDER — ONDANSETRON HCL 4 MG/2ML IJ SOLN
INTRAMUSCULAR | Status: DC | PRN
  Administered 2013-12-22: 4 mg via INTRAVENOUS

## 2013-12-22 MED ORDER — PROPOFOL 10 MG/ML IV BOLUS
INTRAVENOUS | Status: AC
Start: 2013-12-22 — End: 2013-12-22
  Filled 2013-12-22: qty 20

## 2013-12-22 MED ORDER — ROCURONIUM BROMIDE 100 MG/10ML IV SOLN
INTRAVENOUS | Status: DC | PRN
  Administered 2013-12-22: 15 mg via INTRAVENOUS
  Administered 2013-12-22: 35 mg via INTRAVENOUS

## 2013-12-22 MED ORDER — PHENYLEPHRINE 40 MCG/ML (10ML) SYRINGE FOR IV PUSH (FOR BLOOD PRESSURE SUPPORT)
PREFILLED_SYRINGE | INTRAVENOUS | Status: AC
  Filled 2013-12-22: qty 10

## 2013-12-22 MED ORDER — NEOSTIGMINE METHYLSULFATE 10 MG/10ML IV SOLN
INTRAVENOUS | Status: DC | PRN
  Administered 2013-12-22: 4 mg via INTRAVENOUS

## 2013-12-22 MED ORDER — LACTATED RINGERS IV SOLN
INTRAVENOUS | Status: DC | PRN
  Administered 2013-12-22: 10:00:00 via INTRAVENOUS

## 2013-12-22 MED ORDER — FENTANYL CITRATE 0.05 MG/ML IJ SOLN
INTRAMUSCULAR | Status: AC
Start: 2013-12-22 — End: 2013-12-22
  Filled 2013-12-22: qty 5

## 2013-12-22 MED ORDER — MIDAZOLAM HCL 2 MG/2ML IJ SOLN
INTRAMUSCULAR | Status: AC
  Filled 2013-12-22: qty 2

## 2013-12-22 MED ORDER — EPHEDRINE SULFATE 50 MG/ML IJ SOLN
INTRAMUSCULAR | Status: AC
  Filled 2013-12-22: qty 1

## 2013-12-22 MED ORDER — PROPOFOL 10 MG/ML IV BOLUS
INTRAVENOUS | Status: DC | PRN
  Administered 2013-12-22: 200 mg via INTRAVENOUS
  Administered 2013-12-22 (×2): 30 mg via INTRAVENOUS

## 2013-12-22 MED ORDER — ONDANSETRON HCL 4 MG/2ML IJ SOLN
INTRAMUSCULAR | Status: AC
  Filled 2013-12-22: qty 2

## 2013-12-22 MED ORDER — LACTATED RINGERS IV SOLN: INTRAVENOUS | Status: DC

## 2013-12-22 MED ORDER — LIDOCAINE-EPINEPHRINE 2 %-1:100000 IJ SOLN
INTRAMUSCULAR | Status: DC | PRN
  Administered 2013-12-22: 10 mL via INTRADERMAL

## 2013-12-22 MED ORDER — BUPIVACAINE-EPINEPHRINE 0.5% -1:200000 IJ SOLN
INTRAMUSCULAR | Status: DC | PRN
  Administered 2013-12-22: 10 mL

## 2013-12-22 MED ORDER — BUPIVACAINE-EPINEPHRINE (PF) 0.5% -1:200000 IJ SOLN
INTRAMUSCULAR | Status: AC
  Filled 2013-12-22: qty 3.6

## 2013-12-22 MED ORDER — LIDOCAINE-EPINEPHRINE 2 %-1:100000 IJ SOLN
INTRAMUSCULAR | Status: AC
  Filled 2013-12-22: qty 3.4

## 2013-12-22 MED ORDER — MORPHINE SULFATE 2 MG/ML IJ SOLN
1.0000 mg | INTRAMUSCULAR | Status: DC | PRN
  Administered 2013-12-22 – 2013-12-23 (×4): 1 mg via INTRAVENOUS
  Filled 2013-12-22 (×4): qty 1

## 2013-12-22 MED ORDER — LACTATED RINGERS IV SOLN
INTRAVENOUS | Status: DC
  Administered 2013-12-22: 10:00:00 via INTRAVENOUS

## 2013-12-22 MED ORDER — ROCURONIUM BROMIDE 50 MG/5ML IV SOLN
INTRAVENOUS | Status: AC
  Filled 2013-12-22: qty 1

## 2013-12-22 MED ORDER — 0.9 % SODIUM CHLORIDE (POUR BTL) OPTIME
TOPICAL | Status: DC | PRN
  Administered 2013-12-22: 1000 mL

## 2013-12-22 MED ORDER — HYDROMORPHONE HCL 1 MG/ML IJ SOLN
0.2500 mg | INTRAMUSCULAR | Status: DC | PRN
Start: 2013-12-22 — End: 2013-12-22

## 2013-12-22 SURGICAL SUPPLY — 32 items
ALCOHOL 70% 16 OZ (MISCELLANEOUS) ×3 IMPLANT
ATTRACTOMAT 16X20 MAGNETIC DRP (DRAPES) ×3 IMPLANT
BLADE SURG 15 STRL LF DISP TIS (BLADE) ×2 IMPLANT
BLADE SURG 15 STRL SS (BLADE) ×4
COVER SURGICAL LIGHT HANDLE (MISCELLANEOUS) ×3 IMPLANT
GAUZE PACKING FOLDED 2  STR (GAUZE/BANDAGES/DRESSINGS) ×2
GAUZE PACKING FOLDED 2 STR (GAUZE/BANDAGES/DRESSINGS) ×1 IMPLANT
GAUZE SPONGE 4X4 16PLY XRAY LF (GAUZE/BANDAGES/DRESSINGS) ×3 IMPLANT
GLOVE BIOGEL PI IND STRL 6 (GLOVE) ×1 IMPLANT
GLOVE BIOGEL PI INDICATOR 6 (GLOVE) ×2
GLOVE SURG ORTHO 8.0 STRL STRW (GLOVE) ×3 IMPLANT
GLOVE SURG SS PI 6.0 STRL IVOR (GLOVE) ×3 IMPLANT
GOWN STRL REUS W/ TWL LRG LVL3 (GOWN DISPOSABLE) ×1 IMPLANT
GOWN STRL REUS W/TWL 2XL LVL3 (GOWN DISPOSABLE) ×3 IMPLANT
GOWN STRL REUS W/TWL LRG LVL3 (GOWN DISPOSABLE) ×2
KIT BASIN OR (CUSTOM PROCEDURE TRAY) ×3 IMPLANT
KIT ROOM TURNOVER OR (KITS) ×3 IMPLANT
MANIFOLD NEPTUNE WASTE (CANNULA) ×3 IMPLANT
NEEDLE BLUNT 16X1.5 OR ONLY (NEEDLE) ×3 IMPLANT
NS IRRIG 1000ML POUR BTL (IV SOLUTION) ×3 IMPLANT
PACK EENT II TURBAN DRAPE (CUSTOM PROCEDURE TRAY) ×3 IMPLANT
PAD ARMBOARD 7.5X6 YLW CONV (MISCELLANEOUS) ×3 IMPLANT
SPONGE SURGIFOAM ABS GEL 100 (HEMOSTASIS) IMPLANT
SPONGE SURGIFOAM ABS GEL 12-7 (HEMOSTASIS) ×3 IMPLANT
SPONGE SURGIFOAM ABS GEL SZ50 (HEMOSTASIS) IMPLANT
SUCTION FRAZIER TIP 10 FR DISP (SUCTIONS) ×3 IMPLANT
SUT CHROMIC 3 0 PS 2 (SUTURE) ×6 IMPLANT
SYR 50ML SLIP (SYRINGE) ×3 IMPLANT
TOWEL OR 17X26 10 PK STRL BLUE (TOWEL DISPOSABLE) ×3 IMPLANT
TUBE CONNECTING 12'X1/4 (SUCTIONS) ×1
TUBE CONNECTING 12X1/4 (SUCTIONS) ×2 IMPLANT
YANKAUER SUCT BULB TIP NO VENT (SUCTIONS) ×3 IMPLANT

## 2013-12-22 NOTE — Anesthesia Procedure Notes (Signed)
Procedure Name: Intubation Date/Time: 12/22/2013 10:40 AM Performed by: Susa Loffler Pre-anesthesia Checklist: Patient identified, Timeout performed, Emergency Drugs available, Suction available and Patient being monitored Patient Re-evaluated:Patient Re-evaluated prior to inductionOxygen Delivery Method: Circle system utilized Preoxygenation: Pre-oxygenation with 100% oxygen Intubation Type: IV induction Ventilation: Mask ventilation without difficulty Laryngoscope Size: Mac and 4 Grade View: Grade I Tube type: Oral Tube size: 7.5 mm Number of attempts: 1 Airway Equipment and Method: Stylet and LTA kit utilized Placement Confirmation: ETT inserted through vocal cords under direct vision,  positive ETCO2 and breath sounds checked- equal and bilateral Secured at: 23 cm Tube secured with: Tape Dental Injury: Teeth and Oropharynx as per pre-operative assessment

## 2013-12-22 NOTE — Progress Notes (Signed)
Pt arrived to room 36 for oral surgery;on arrival IV is not in his skin at all

## 2013-12-22 NOTE — Progress Notes (Signed)
[facility] PROGRESS NOTE  Bradley Hanson WGN:[id number] DOB: 1990-09-10 DOA: 12/18/2013 PCP: Johny Blamer, MD  Assessment/Plan: Active Problems:   Mandibular abscess #30 abscess with facial cellulitis/submental and floor of mouth soft tissue infection  - Continue IV clindamycin  - Dental surgery was consulted, patient was seen by Dr. Robin Searing, pt is s/p dental extraction of tooth # 30 - Continue pain control  Code Status: Full Family Communication: Discussed with patient and family at bedside Disposition Plan: We'll plan on transitioning to oral antibiotics next a.m. and if okay with them to service most likely discharge next a.m. 12/23/2013   Consultants: - Dental medicine: Kristin Bruins  Procedures: - As listed above  Antibiotics:  Clindamycin  HPI/Subjective: Patient has no new complaints. No acute issues overnight  Objective: Filed Vitals:   12/22/13 1213  BP: 118/68  Pulse: 94  Temp: 97.8 F (36.6 C)  Resp: 16    Intake/Output Summary (Last 24 hours) at 12/22/13 1611 Last data filed at 12/22/13 1341  Gross per 24 hour  Intake   1570 ml  Output    853 ml  Net    717 ml   Filed Weights   12/18/13 0821 12/18/13 1353  Weight: 113.399 kg (250 lb) 120.5 kg (265 lb 10.5 oz)    Exam:   General:  Patient in no acute distress, alert and awake  Cardiovascular: Regular rate and rhythm, no murmurs or rubs  Respiratory: Clear to auscultation bilaterally no wheezes  Abdomen: Soft, nondistended, nontender  Musculoskeletal: No cyanosis or clubbing  HEENT: Patient has swelling right side of face where he had recent tooth extraction no active bleeding currently   Data Reviewed: Basic Metabolic Panel:  Recent Labs Lab 12/18/13 0906 12/18/13 1441 12/19/13 0504  NA 138  --  138  K 4.4  --  4.6  CL 104  --  102  CO2  --   --  25  GLUCOSE 103*  --  100*  BUN 12  --  5*  CREATININE 1.40* 1.03 1.08  CALCIUM  --   --  9.0   Liver Function Tests: No  results found for this basename: AST, ALT, ALKPHOS, BILITOT, PROT, ALBUMIN,  in the last 168 hours No results found for this basename: LIPASE, AMYLASE,  in the last 168 hours No results found for this basename: AMMONIA,  in the last 168 hours CBC:  Recent Labs Lab 12/18/13 0859 12/18/13 0906 12/18/13 1441 12/19/13 0504 12/21/13 0415  WBC 12.8*  --  11.3* 7.8 4.5  HGB 14.7 16.7 14.1 12.9* 12.7*  HCT 45.2 49.0 43.0 40.0 39.1  MCV 92.2  --  91.3 93.0 91.1  PLT 340  --  242 224 246   Cardiac Enzymes: No results found for this basename: CKTOTAL, CKMB, CKMBINDEX, TROPONINI,  in the last 168 hours BNP (last 3 results) No results found for this basename: PROBNP,  in the last 8760 hours CBG: No results found for this basename: GLUCAP,  in the last 168 hours  Recent Results (from the past 240 hour(s))  MRSA PCR SCREENING     Status: None   Collection Time    12/18/13  2:01 PM      Result Value Ref Range Status   MRSA by PCR NEGATIVE  NEGATIVE Final   Comment:            The GeneXpert MRSA Assay (FDA     approved for NASAL specimens     only), is one component of a  comprehensive MRSA colonization     surveillance program. It is not     intended to diagnose MRSA     infection nor to guide or     monitor treatment for     MRSA infections.  MRSA PCR SCREENING     Status: None   Collection Time    12/21/13  1:48 PM      Result Value Ref Range Status   MRSA by PCR NEGATIVE  NEGATIVE Final   Comment:            The GeneXpert MRSA Assay (FDA     approved for NASAL specimens     only), is one component of a     comprehensive MRSA colonization     surveillance program. It is not     intended to diagnose MRSA     infection nor to guide or     monitor treatment for     MRSA infections.     Studies: No results found.  Scheduled Meds: . clindamycin (CLEOCIN) IV  600 mg Intravenous 4 times per day  . pneumococcal 23 valent vaccine  0.5 mL Intramuscular Tomorrow-1000    Continuous Infusions: . lactated ringers 50 mL/hr at 12/22/13 1014  . lactated ringers       Time spent: > 35 minutes    Penny Pia  [facility] Pager [phone]. If 7PM-7AM, please contact night-coverage at [email or web address], password [facility] 12/22/2013, 4:11 PM  LOS: 4 days

## 2013-12-22 NOTE — Progress Notes (Signed)
PRE-OPERATIVE NOTE:  12/22/2013 Bradley Hanson [id number]  VITALS: BP 110/67  Pulse 56  Temp(Src) 97.4 F (36.3 C) (Oral)  Resp 16  Ht  (1.88 m)  Wt 265 lb 10.5 oz (120.5 kg)  BMI 34.09 kg/m2  SpO2 98%  Lab Results  Component Value Date   WBC 4.5 12/21/2013   HGB 12.7* 12/21/2013   HCT 39.1 12/21/2013   MCV 91.1 12/21/2013   PLT 246 12/21/2013   BMET    Component Value Date/Time   NA 138 12/19/2013 0504   K 4.6 12/19/2013 0504   CL 102 12/19/2013 0504   CO2 25 12/19/2013 0504   GLUCOSE 100* 12/19/2013 0504   BUN 5* 12/19/2013 0504   CREATININE 1.08 12/19/2013 0504   CALCIUM 9.0 12/19/2013 0504   GFRNONAA >90 12/19/2013 0504   GFRAA >90 12/19/2013 0504    No results found for this basename: INR, PROTIME   No results found for this basename: PTT     Bradley Hanson is scheduled for extraction of indicated teeth with alveoloplasty and gross debridement of remaining dentition in the OR with general anesthesia. The patient agrees to extraction of tooth #30 and other teeth if found to need extraction with the EXCEPTION of tooth #9 that he does NOT want extracted today. The patient was instructed to followup with his primary dentist for an exam, dental E. grafts, and discussion of treatment needs at that time. Patient is aware of the presence of multiple dental caries and fracture tooth #9.  SUBJECTIVE: The patient denies any acute medical or dental changes and agrees to proceed with treatment as planned.  EXAM: No sign of acute dental changes.  ASSESSMENT: Patient is affected by acute pulpitis, buccal abscess #30, chronic periodontitis, dental caries, and accretions.  PLAN: Patient agrees to proceed with treatment as planned in the operating room as previously discussed and accepts the risks, benefits, complications of the proposed treatment.  Patient accepts risks of bleeding, bruising, swelling, infection, pain, root tip fracture, mandible fracture, sinus involvement, nerve damage,  and risks of general anesthesia as well as other potential complications not mentioned above.   Charlynne Pander, DDS

## 2013-12-22 NOTE — Transfer of Care (Signed)
Immediate Anesthesia Transfer of Care Note  Patient: Bradley Hanson  Procedure(s) Performed: Procedure(s): EXTRACTION OF TOOTH #30 WITH ALVEOLOPLASTY AND GROSS DEBRIDEMENT OF REMAINING TEETH  (N/A)  Patient Location: PACU  Anesthesia Type:General  Level of Consciousness: awake, alert  and oriented  Airway & Oxygen Therapy: Patient Spontanous Breathing and Patient connected to nasal cannula oxygen  Post-op Assessment: Report given to PACU RN and Post -op Vital signs reviewed and stable  Post vital signs: Reviewed and stable  Complications: No apparent anesthesia complications

## 2013-12-22 NOTE — ED Provider Notes (Signed)
Medical screening examination/treatment/procedure(s) were conducted as a shared visit with non-physician practitioner(s) and myself.  I personally evaluated the patient during the encounter.  See my additional note   EKG Interpretation None       Glynn Octave, MD 12/22/13 1039

## 2013-12-22 NOTE — Anesthesia Preprocedure Evaluation (Addendum)
Anesthesia Evaluation  Patient identified by MRN, date of birth, ID band Patient awake    Reviewed: Allergy & Precautions, H&P , NPO status , Patient's Chart, lab work & pertinent test results  Airway Mallampati: II      Dental   Pulmonary Current Smoker,  breath sounds clear to auscultation        Cardiovascular negative cardio ROS  Rhythm:Regular Rate:Normal     Neuro/Psych  Headaches,    GI/Hepatic negative GI ROS, Neg liver ROS,   Endo/Other    Renal/GU      Musculoskeletal   Abdominal   Peds  Hematology   Anesthesia Other Findings   Reproductive/Obstetrics                         Anesthesia Physical Anesthesia Plan  ASA: II  Anesthesia Plan: General   Post-op Pain Management:    Induction: Intravenous  Airway Management Planned: Oral ETT  Additional Equipment:   Intra-op Plan:   Post-operative Plan: Extubation in OR  Informed Consent:   Dental advisory given  Plan Discussed with: CRNA, Anesthesiologist and Surgeon  Anesthesia Plan Comments:        Anesthesia Quick Evaluation

## 2013-12-22 NOTE — Anesthesia Postprocedure Evaluation (Signed)
  Anesthesia Post-op Note  Patient: Bradley Hanson  Procedure(s) Performed: Procedure(s): EXTRACTION OF TOOTH #30 WITH ALVEOLOPLASTY AND GROSS DEBRIDEMENT OF REMAINING TEETH  (N/A)  Patient Location: PACU  Anesthesia Type:General evel of Consciousness: awake  Airway and Oxygen Therapy: Patient Spontanous Breathing  Post-op Pain: mild  Post-op Assessment: Post-op Vital signs reviewed  Post-op Vital Signs: Reviewed  Last Vitals:  Filed Vitals:   12/22/13 1213  BP: 118/68  Pulse: 94  Temp: 36.6 C  Resp: 16    Complications: No apparent anesthesia complications

## 2013-12-22 NOTE — Op Note (Signed)
Patient:            Bradley Hanson Date of Birth:  [birth year] MRN:                [id number]   DATE OF PROCEDURE:  12/22/2013               OPERATIVE REPORT   PREOPERATIVE DIAGNOSES: 1. History of right facial swelling 2. Periapical abscess 3. Chronic apical periodontitis  4. Rampant dental caries 5. Chronic periodontitis 6. Accretions 7. History of acute pulpitis  POSTOPERATIVE DIAGNOSES: 1. History of right facial swelling 2. Periapical abscess 3. Chronic apical periodontitis  4. Rampant dental caries 5. Chronic periodontitis 6. Accretions 7. History of acute pulpitis  OPERATIONS: 1. Surgical extraction of tooth #30 2. Mandibular right alveoloplasty 3. Gross debridement of remaining dentition   SURGEON: Charlynne Pander, DDS  ASSISTANT: Rory Percy, (dental assistant)  ANESTHESIA: General anesthesia via oral endotracheal tube.  MEDICATIONS: 1. Clindamycin IV antibiotic therapy per previous orders 2. 2 Carpules each containing 34 mg of lidocaine with 0.017 mg of epinephrine as well as 2 carpules each containing 9 mg of bupivacaine with 0.009 mg of epinephrine.  SPECIMENS: There is one tooth that was discarded.  DRAINS: None  CULTURES: None  COMPLICATIONS: None   ESTIMATED BLOOD LOSS: Less than 15 mLs.  INTRAVENOUS FLUIDS: 800 mLs of Lactated ringers solution.  INDICATIONS: The patient was recently diagnosed with right facial swelling.  A dental consultation was then requested to evaluate poor dentition and to provide treatment as indicated.  The patient was examined and treatment planned for extraction of indicated teeth with alveoloplasty and gross debridement of remaining dentition.  This treatment plan was formulated to decrease the risks and complications associated with dental infection from affecting the patient's systemic health.  OPERATIVE FINDINGS: Patient was examined in operating room number 6.  The teeth were identified for extraction. Patient  has multiple extensive dental caries that need evaluation by a primary dentist for treatment options. Patient has tooth #9 that is fractured off at the gum line but the patient did not want to proceed with extraction of this tooth at this time and would entertain possible root canal therapy, crown lengthening therapy, and crown fabrication. The patient was noted be affected by history right facial swelling, history of acute pulpitis, chronic apical periodontitis, multiple dental caries,chronic periodontitis, and accretions.   DESCRIPTION OF PROCEDURE: Patient was brought to the main operating room number 6. Patient was then placed in the supine position on the operating table. General anesthesia was then induced per the anesthesia team. The patient was then prepped and draped in the usual manner for a dental medicine procedure. A timeout was performed. The patient was identified and procedures were verified. A throat pack was placed at this time. The oral cavity was then thoroughly examined with the findings as noted above. The patient was then ready for dental medicine procedure as follows:  Local anesthesia was then administered sequentially with a total utilization of 2 carpules each containing 34 mg of lidocaine with 0.017 mg of epinephrine as well as 2 carpules  each containing 9 mg bupivacaine with 0.009 mg of epinephrine.  The mandibular quadrants were approached. The patient was given bilateral inferior alveolar nerve blocks and long buccal nerve blocks utilizing the bupivacaine with epinephrine. Further infiltration was then achieved utilizing the lidocaine with epinephrine. A 15 blade incision was then made from the distal of number 31 and extended to the mesial of #29.  A surgical flap was then carefully reflected. Appropriate amounts of buccal and interseptal bone were then removed utilizing a surgical handpiece and copious amount of sterile water around tooth #30. Tooth #30 was then subluxated  with a series of straight elevators. Tooth #30 then had the coronal portion and distal root removed with a 23 forceps. Further bone was then removed around the mesial root #30. The root was then elevated out with a series of cryers elevators without complication. Alveoloplasty was then performed utilizing a rongeur and bone file.The tissues were approximated and trimmed appropriately. The surgical site was then irrigated with copious amounts of sterile saline. A piece of Surgifoam was placed in the extraction socket appropriately. The surgical site was then closed from the distal of #30 and extended to the distal of #29 utilizing 3-0 chromic gut suture material in a continuous interrupted suture technique x1. An interproximal suture was then placed between tooth numbers 28 and 29 appropriately utilizing 3-0 chromic gut material.    At this point in time, the remaining dentition was approached. A sonic scaler was then utilized to remove accretions. A series of hand curettes were utilized to refine the removal of accretions. A sonic scaler was then again used to further refine removal of accretions.  At this point in time extensive dental caries were noted at the gum line. This will need to be addressed by a primary dentist in the future.  At this point time, the entire mouth was irrigated with copious amounts of sterile saline. The patient was examined for complications, seeing none, the dental medicine procedure was deemed to be complete. The throat pack was removed at this time. A series of 4 x 4 gauze were placed in the mouth to aid hemostasis. The patient was then handed over to the anesthesia team for final disposition. An oral airway was placed at the request of the anesthesia team.  After an appropriate amount of time, the patient was extubated and taken to the postanesthsia care unit with stable vital signs and a good condition. All counts were correct for the dental medicine procedure.  The patient is  acceptable for discharge from a dental standpoint. The patient will be prescribed oral antibiotic therapy as indicated by the hospitalist team. Patient will be given postop pain medication as indicated by the hospitalist team. The patient will then contact dental medicine for follow up evaluation of healing and suture removal upon discharge.  Charlynne Pander, DDS.

## 2013-12-23 ENCOUNTER — Encounter (HOSPITAL_COMMUNITY): Payer: Self-pay | Admitting: Dentistry

## 2013-12-23 MED ORDER — CLINDAMYCIN HCL 300 MG PO CAPS: 300.0000 mg | ORAL_CAPSULE | Freq: Four times a day (QID) | ORAL | Status: DC

## 2013-12-23 MED ORDER — MELOXICAM 7.5 MG PO TABS: 7.5000 mg | ORAL_TABLET | Freq: Every day | ORAL | Status: DC

## 2013-12-23 MED ORDER — OXYCODONE HCL 5 MG PO TABS: 5.0000 mg | ORAL_TABLET | Freq: Four times a day (QID) | ORAL | Status: DC | PRN

## 2013-12-23 NOTE — Care Management Note (Signed)
  Page 1 of 1   12/23/2013     1:35:09 PM CARE MANAGEMENT NOTE 12/23/2013  Patient:  Bradley Hanson, Bradley Hanson   Account Number:  [id number]  Date Initiated:  12/23/2013  Documentation initiated by:  Ronny Flurry  Subjective/Objective Assessment:     Action/Plan:   Anticipated DC Date:  12/23/2013   Anticipated DC Plan:  HOME/SELF CARE      DC Planning Services  MATCH Program  [facility]      Choice offered to / List presented to:             Status of service:   Medicare Important Message given?   (If response is "NO", the following Medicare IM given date fields will be blank) Date Medicare IM given:   Medicare IM given by:   Date Additional Medicare IM given:   Additional Medicare IM given by:    Discharge Disposition:    Per UR Regulation:    If discussed at Long Length of Stay Meetings, dates discussed:    Comments:  12-23-13 MATCH letter explained and given prescription for 3 days of pain medicine , exception entered . Following resources given to patinet:   Primary Care : [facility] and [facility] [address] [place] , [place] [address] Phone [phone]  [facility] [address] , [place] Phone [phone]

## 2013-12-23 NOTE — Discharge Instructions (Signed)
MOUTH CARE AFTER SURGERY ° °FACTS: °· Ice used in ice bag helps keep the swelling down, and can help lessen the pain. °· It is easier to treat pain BEFORE it happens. °· Spitting disturbs the clot and may cause bleeding to start again, or to get worse. °· Smoking delays healing and can cause complications. °· Sharing prescriptions can be dangerous.  Do not take medications not recently prescribed for you. °· Antibiotics may stop birth control pills from working.  Use other means of birth control while on antibiotics. °· Warm salt water rinses after the first 24 hours will help lessen the swelling:  Use 1/2 teaspoonful of table salt per oz.of water. ° °DO NOT: °· Do not spit.  Do not drink through a straw. °· Strongly advised not to smoke, dip snuff or chew tobacco at least for 3 days. °· Do not eat sharp or crunchy foods.  Avoid the area of surgery when chewing. °· Do not stop your antibiotics before your instructions say to do so. °· Do not eat hot foods until bleeding has stopped.  If you need to, let your food cool down to room temperature. ° °EXPECT: °· Some swelling, especially first 2-3 days. °· Soreness or discomfort in varying degrees.  Follow your dentist's instructions about how to handle pain before it starts. °· Pinkish saliva or light blood in saliva, or on your pillow in the morning.  This can last around 24 hours. °· Bruising inside or outside the mouth.  This may not show up until 2-3 days after surgery.  Don't worry, it will go away in time. °· Pieces of "bone" may work themselves loose.  It's OK.  If they bother you, let us know. ° °WHAT TO DO IMMEDIATELY AFTER SURGERY: °· Bite on the gauze with steady pressure for 1-2 hours.  Don't chew on the gauze. °· Do not lie down flat.  Raise your head support especially for the first 24 hours. °· Apply ice to your face on the side of the surgery.  You may apply it 20 minutes on and a few minutes off.  Ice for 8-12 hours.  You may use ice up to 24  hours. °· Before the numbness wears off, take a pain pill as instructed. °· Prescription pain medication is not always required. ° °SWELLING: °· Expect swelling for the first couple of days.  It should get better after that. °· If swelling increases 3 days or so after surgery; let us know as soon as possible. ° °FEVER: °· Take Tylenol every 4 hours if needed to lower your temperature, especially if it is at 100F or higher. °· Drink lots of fluids. °· If the fever does not go away, let us know. ° °BREATHING TROUBLE: °· Any unusual difficulty breathing means you have to have someone bring you to the emergency room ASAP ° °BLEEDING: °· Light oozing is expected for 24 hours or so. °· Prop head up with pillows °· Avoid spitting °· Do not confuse bright red fresh flowing blood with lots of saliva colored with a little bit of blood. °· If you notice some bleeding, place gauze or a tea bag where it is bleeding and apply CONSTANT pressure by biting down for 1 hour.  Avoid talking during this time.  Do not remove the gauze or tea bag during this hour to "check" the bleeding. °· If you notice bright RED bleeding FLOWING out of particular area, and filling the floor of your mouth, put   a wad of gauze on that area, bite down firmly and constantly.  Call us immediately.  If we're closed, have someone bring you to the emergency room.  ORAL HYGIENE:  Brush your teeth as usual after meals and before bedtime.  Use a soft toothbrush around the area of surgery.  DO NOT AVOID BRUSHING.  Otherwise bacteria(germs) will grow and may delay healing or encourage infection.  Since you cannot spit, just gently rinse and let the water flow out of your mouth.  DO NOT SWISH HARD.  EATING:  Cool liquids are a good point to start.  Increase to soft foods as tolerated.  PRESCRIPTIONS:  Follow the directions for your prescriptions exactly as written.  If Dr. Kristin Bruins gave you a narcotic pain medication, do not drive, operate  machinery or drink alcohol when on that medication.  QUESTIONS:  Call our office during office hours [phone] or call the Emergency Room at [phone].   Primary Care :  [facility] and [facility]  [address] [place] , [place] [address]  Phone [phone]  [facility]  [address] , [place]  Phone [phone]

## 2013-12-23 NOTE — Discharge Summary (Signed)
Physician Discharge Summary  Bradley Hanson ZOX:[id number] DOB: 1991-01-15 DOA: 12/18/2013  PCP: Johny Blamer, MD  Admit date: 12/18/2013 Discharge date: 12/23/2013  Time spent: > 35 minutes  Recommendations for Outpatient Follow-up:  1. Recommended patient f/u with a dentist as an outpatient for further evaluation and recommendations.  Discharge Diagnoses:  Active Problems:   Dental caries   Chronic periodontitis   Discharge Condition: stable  Diet recommendation: soft, bland diet  Filed Weights   12/18/13 0821 12/18/13 1353  Weight: 113.399 kg (250 lb) 120.5 kg (265 lb 10.5 oz)    History of present illness:  23 y/o who presented with right lower tooth pain and right submandibular swelling for 5 days.  CT scan reporting a right submandibular tood abcess at tooth 30.  Hospital Course:  Mandibular abscess #30 abscess with facial cellulitis/submental and floor of mouth soft tissue infection  - Continue oral clindamycin for 7 more days post discharge. - Dental surgery was consulted, patient was seen by Dr. Robin Searing, pt is s/p dental extraction of tooth # 30 : Please refer to his intraoperative note regarding details of procedure performed. - prescribe an opiod to assist for pain control for the next 3 days but otherwise patient should use a non steroidal anti inflammatory.  - Recommended he f/u with a primary dentist   Procedures: 1. Surgical extraction of tooth #30  2. Mandibular right alveoloplasty  3. Gross debridement of remaining dentition  Consultations:  Dentist: Dr. Kristin Bruins  Discharge Exam: Filed Vitals:   12/23/13 0547  BP: 130/81  Pulse: 87  Temp: 98 F (36.7 C)  Resp: 20    General: Pt in nad, alert and awake Cardiovascular: warm extremities, no cyanosis Respiratory: no audible wheezes, no increased wob  Discharge Instructions You were cared for by a hospitalist during your hospital stay. If you have any questions about your discharge medications or  the care you received while you were in the hospital after you are discharged, you can call the unit and asked to speak with the hospitalist on call if the hospitalist that took care of you is not available. Once you are discharged, your primary care physician will handle any further medical issues. Please note that NO REFILLS for any discharge medications will be authorized once you are discharged, as it is imperative that you return to your primary care physician (or establish a relationship with a primary care physician if you do not have one) for your aftercare needs so that they can reassess your need for medications and monitor your lab values.  Discharge Instructions   Call MD for:  severe uncontrolled pain    Complete by:  As directed      Call MD for:  temperature >100.4    Complete by:  As directed      Diet - low sodium heart healthy    Complete by:  As directed      Increase activity slowly    Complete by:  As directed           Current Discharge Medication List    START taking these medications   Details  clindamycin (CLEOCIN) 300 MG capsule Take 1 capsule (300 mg total) by mouth every 6 (six) hours. Qty: 28 capsule, Refills: 0    meloxicam (MOBIC) 7.5 MG tablet Take 1 tablet (7.5 mg total) by mouth daily. Qty: 14 tablet, Refills: 0    oxyCODONE (OXY IR/ROXICODONE) 5 MG immediate release tablet Take 1 tablet (5 mg total) by mouth  every 6 (six) hours as needed for moderate pain. Qty: 12 tablet, Refills: 0      CONTINUE these medications which have NOT CHANGED   Details  Multiple Vitamin (MULTIVITAMIN WITH MINERALS) TABS tablet Take 1 tablet by mouth daily.      STOP taking these medications     clonazePAM (KLONOPIN) 1 MG tablet      oxyCODONE-acetaminophen (PERCOCET) 10-325 MG per tablet        No Known Allergies Follow-up Information   Follow up with Charlynne Pander, DDS. Schedule an appointment as soon as possible for a visit on 01/03/2014. (For suture  removal)    Specialty:  Dentistry   Contact information:   [address] [place] [address] [phone]        The results of significant diagnostics from this hospitalization (including imaging, microbiology, ancillary and laboratory) are listed below for reference.    Significant Diagnostic Studies: Dg Orthopantogram  12/19/2013   CLINICAL DATA:  Right lower dental abscess.  EXAM: ORTHOPANTOGRAM/PANORAMIC  COMPARISON:  None.  FINDINGS: Right lower second molar periapical lucency is identified concerning for small abscess. A second periapical lucency is identified at the right lower second bicuspid. This is somewhat obscured by motion.  IMPRESSION: 1. Right lower second molar periapical abscess. 2. Equivocal lucency around the right lower second bicuspid which may also represent a small periapical abscess.   Electronically Signed   By: Signa Kell M.D.   On: 12/19/2013 19:03   Ct Soft Tissue Neck W Contrast  12/18/2013   CLINICAL DATA:  Dental pain and swelling under tongue. Trismus and voice change. Pain right side internal.  EXAM: CT NECK WITH CONTRAST  TECHNIQUE: Multidetector CT imaging of the neck was performed using the standard protocol following the bolus administration of intravenous contrast.  CONTRAST:  75mL OMNIPAQUE IOHEXOL 300 MG/ML  SOLN  COMPARISON:  None.  FINDINGS: There are periapical lucencies about the right mandibular tooth number 30. Extending medially from the periapical lucencies is a bony sinus tract extending through the medial cortex of the mandible.  At the level is bony sinus tract, there is soft tissue thickening with central low density about the right aspect of the mandible consistent with a periapical abscess. This results in some mass effect on the floor of the mouth. No discrete rim enhancement is seen at this time. Periapical abscess measures approximately 4.0 cm AP diameter by 2.2 cm transverse diameter along the medial aspect of the mandible. There  is also some soft tissue inflammation/thickening lateral to the mandible at this level.  There are multiple dental caries, involving at least the following teeth:  Maxilla:  Teeth numbers 2, 8, and 9  Mandible: Teeth numbers 28, 29, 30. There may be a small caries involving teeth 24 and 25.  Imaged portion of the brain is unremarkable.  There are reactive sized submental lymph nodes.  Negative for a of prevertebral edema.  IMPRESSION: 1. Periapical lucencies about mandibular tooth #30 with an associated bony sinus tract and adjacent periapical abscess.  2. Multiple dental caries.   Electronically Signed   By: Britta Mccreedy M.D.   On: 12/18/2013 11:03    Microbiology: Recent Results (from the past 240 hour(s))  MRSA PCR SCREENING     Status: None   Collection Time    12/18/13  2:01 PM      Result Value Ref Range Status   MRSA by PCR NEGATIVE  NEGATIVE Final   Comment:  The GeneXpert MRSA Assay (FDA     approved for NASAL specimens     only), is one component of a     comprehensive MRSA colonization     surveillance program. It is not     intended to diagnose MRSA     infection nor to guide or     monitor treatment for     MRSA infections.  MRSA PCR SCREENING     Status: None   Collection Time    12/21/13  1:48 PM      Result Value Ref Range Status   MRSA by PCR NEGATIVE  NEGATIVE Final   Comment:            The GeneXpert MRSA Assay (FDA     approved for NASAL specimens     only), is one component of a     comprehensive MRSA colonization     surveillance program. It is not     intended to diagnose MRSA     infection nor to guide or     monitor treatment for     MRSA infections.     Labs: Basic Metabolic Panel:  Recent Labs Lab 12/18/13 0906 12/18/13 1441 12/19/13 0504  NA 138  --  138  K 4.4  --  4.6  CL 104  --  102  CO2  --   --  25  GLUCOSE 103*  --  100*  BUN 12  --  5*  CREATININE 1.40* 1.03 1.08  CALCIUM  --   --  9.0   Liver Function Tests: No  results found for this basename: AST, ALT, ALKPHOS, BILITOT, PROT, ALBUMIN,  in the last 168 hours No results found for this basename: LIPASE, AMYLASE,  in the last 168 hours No results found for this basename: AMMONIA,  in the last 168 hours CBC:  Recent Labs Lab 12/18/13 0859 12/18/13 0906 12/18/13 1441 12/19/13 0504 12/21/13 0415  WBC 12.8*  --  11.3* 7.8 4.5  HGB 14.7 16.7 14.1 12.9* 12.7*  HCT 45.2 49.0 43.0 40.0 39.1  MCV 92.2  --  91.3 93.0 91.1  PLT 340  --  242 224 246   Cardiac Enzymes: No results found for this basename: CKTOTAL, CKMB, CKMBINDEX, TROPONINI,  in the last 168 hours BNP: BNP (last 3 results) No results found for this basename: PROBNP,  in the last 8760 hours CBG: No results found for this basename: GLUCAP,  in the last 168 hours     Signed:  Penny Pia  [facility] 12/23/2013, 12:37 PM

## 2013-12-23 NOTE — Clinical Documentation Improvement (Signed)
Presents with periapical abscess with cellulitis to face; had tooth extraction, alveoloplasty, debridement performed.  Please clarify the type of debridement: Excisional vs. nonexcisional                                                Depth of debridement/type of tissue removed? (e.g., skin, subcutaneous tissue, fascia, muscle, bone, other)  Please document findings in next progress note and discharge summary.  Thank You,  Shellee Milo ,RN Clinical Documentation Specialist:  [phone]  [facility]- Health Information Management

## 2014-01-03 ENCOUNTER — Ambulatory Visit (HOSPITAL_COMMUNITY): Payer: Self-pay | Admitting: Dentistry

## 2014-01-03 ENCOUNTER — Encounter (HOSPITAL_COMMUNITY): Payer: Self-pay | Admitting: Dentistry

## 2014-01-03 VITALS — BP 138/83 | HR 78 | Temp 98.5°F

## 2014-01-03 DIAGNOSIS — K08401 Partial loss of teeth, unspecified cause, class I: Secondary | ICD-10-CM

## 2014-01-03 DIAGNOSIS — K029 Dental caries, unspecified: Secondary | ICD-10-CM

## 2014-01-03 DIAGNOSIS — K08409 Partial loss of teeth, unspecified cause, unspecified class: Secondary | ICD-10-CM

## 2014-01-03 DIAGNOSIS — K0530 Chronic periodontitis, unspecified: Secondary | ICD-10-CM

## 2014-01-03 NOTE — Patient Instructions (Signed)
PLAN: 1. Patient was instructed to followup with a general dentist for an exam, cleaning, dental x-rays, and discussion of treatment needs. Patient is aware of the extensive caries associated with tooth #9, #2, #28, and 29.     Patient was provided the information for a FREE dental clinic on October 8 and ninth, 2015. Patient also was provided information for [facility] dental clinic and [employer] school of dent[facility].  2. Patient to continue salt water rinses as needed to aid healing. 3. Patient is to brush after meals and at bedtime. Patient is to floss at bedtime.  Charlynne Pander, DDS

## 2014-01-03 NOTE — Progress Notes (Signed)
POST OPERATIVE NOTE:  01/03/2014 Swan Zayed [id number]  VITALS: BP 138/83  Pulse 78  Temp(Src) 98.5 F (36.9 C) (Oral)  LABS:  Lab Results  Component Value Date   WBC 4.5 12/21/2013   HGB 12.7* 12/21/2013   HCT 39.1 12/21/2013   MCV 91.1 12/21/2013   PLT 246 12/21/2013   BMET    Component Value Date/Time   NA 138 12/19/2013 0504   K 4.6 12/19/2013 0504   CL 102 12/19/2013 0504   CO2 25 12/19/2013 0504   GLUCOSE 100* 12/19/2013 0504   BUN 5* 12/19/2013 0504   CREATININE 1.08 12/19/2013 0504   CALCIUM 9.0 12/19/2013 0504   GFRNONAA >90 12/19/2013 0504   GFRAA >90 12/19/2013 0504    No results found for this basename: INR, PROTIME   No results found for this basename: PTT     Patient is status post extraction of tooth #30 with alveoloplasty and gross debridement of remaining teeth.  SUBJECTIVE: Patient denies any problems for the dental extraction site. Patient denies having any other acute toothache symptoms. Patient has completed all antibiotic therapy.  EXAM: There is no sign of infection, heme, or ooze. Sutures are gone. Extraction site #30 healing in by primary closure.  PROCEDURE: The patient was given a chlorhexidine gluconate rinse for 30 seconds. No sutures need to be removed at this time.   ASSESSMENT: Post operative course is consistent with dental procedures performed in the OR. Extensive dental caries still present.  PLAN: 1. Patient was instructed to followup with a general dentist for an exam, cleaning, dental x-rays, and discussion of treatment needs. Patient is aware of the extensive caries associated with tooth #9, #2, #28, and 29.     Patient was provided the information for a FREE dental clinic on October 8 and ninth, 2015. Patient also was provided information for [facility] dental clinic and [facility] school of dentistry.  2. Patient to continue salt water rinses as needed to aid healing. 3. Patient is to brush after meals and at bedtime. Patient is to floss at  bedtime.  Charlynne Pander, DDS

## 2015-01-13 ENCOUNTER — Encounter (HOSPITAL_COMMUNITY): Payer: Self-pay | Admitting: Oncology

## 2015-01-13 ENCOUNTER — Inpatient Hospital Stay (HOSPITAL_COMMUNITY)
Admission: EM | Admit: 2015-01-13 | Discharge: 2015-01-16 | DRG: 917 | Disposition: A | Discharge status: Home / Self Care | Payer: Self-pay | Attending: Pulmonary Disease | Admitting: Pulmonary Disease

## 2015-01-13 ENCOUNTER — Emergency Department (HOSPITAL_COMMUNITY): Payer: Self-pay

## 2015-01-13 DIAGNOSIS — Z79899 Other long term (current) drug therapy: Secondary | ICD-10-CM

## 2015-01-13 DIAGNOSIS — T401X1A Poisoning by heroin, accidental (unintentional), initial encounter: Principal | ICD-10-CM | POA: Diagnosis present

## 2015-01-13 DIAGNOSIS — J9601 Acute respiratory failure with hypoxia: Secondary | ICD-10-CM | POA: Diagnosis present

## 2015-01-13 DIAGNOSIS — I10 Essential (primary) hypertension: Secondary | ICD-10-CM | POA: Diagnosis present

## 2015-01-13 DIAGNOSIS — D72829 Elevated white blood cell count, unspecified: Secondary | ICD-10-CM | POA: Diagnosis present

## 2015-01-13 DIAGNOSIS — F1110 Opioid abuse, uncomplicated: Secondary | ICD-10-CM | POA: Diagnosis present

## 2015-01-13 DIAGNOSIS — F17210 Nicotine dependence, cigarettes, uncomplicated: Secondary | ICD-10-CM | POA: Diagnosis present

## 2015-01-13 DIAGNOSIS — G40409 Other generalized epilepsy and epileptic syndromes, not intractable, without status epilepticus: Secondary | ICD-10-CM | POA: Diagnosis present

## 2015-01-13 DIAGNOSIS — T404X1A Poisoning by other synthetic narcotics, accidental (unintentional), initial encounter: Secondary | ICD-10-CM | POA: Diagnosis present

## 2015-01-13 DIAGNOSIS — Z8249 Family history of ischemic heart disease and other diseases of the circulatory system: Secondary | ICD-10-CM

## 2015-01-13 DIAGNOSIS — N179 Acute kidney failure, unspecified: Secondary | ICD-10-CM | POA: Diagnosis present

## 2015-01-13 DIAGNOSIS — Z79891 Long term (current) use of opiate analgesic: Secondary | ICD-10-CM

## 2015-01-13 DIAGNOSIS — G9340 Encephalopathy, unspecified: Secondary | ICD-10-CM | POA: Diagnosis present

## 2015-01-13 DIAGNOSIS — T50902A Poisoning by unspecified drugs, medicaments and biological substances, intentional self-harm, initial encounter: Secondary | ICD-10-CM | POA: Diagnosis present

## 2015-01-13 DIAGNOSIS — T50991A Poisoning by other drugs, medicaments and biological substances, accidental (unintentional), initial encounter: Secondary | ICD-10-CM | POA: Diagnosis present

## 2015-01-13 DIAGNOSIS — Z9049 Acquired absence of other specified parts of digestive tract: Secondary | ICD-10-CM

## 2015-01-13 DIAGNOSIS — T401X2A Poisoning by heroin, intentional self-harm, initial encounter: Secondary | ICD-10-CM

## 2015-01-13 DIAGNOSIS — Z23 Encounter for immunization: Secondary | ICD-10-CM

## 2015-01-13 DIAGNOSIS — I469 Cardiac arrest, cause unspecified: Secondary | ICD-10-CM | POA: Diagnosis present

## 2015-01-13 HISTORY — DX: Poisoning by unspecified drugs, medicaments and biological substances, accidental (unintentional), initial encounter: T50.901A

## 2015-01-13 HISTORY — DX: Unspecified convulsions: R56.9

## 2015-01-13 LAB — BLOOD GAS, VENOUS
Acid-base deficit: 2.8 mmol/L — ABNORMAL HIGH (ref 0.0–2.0)
Bicarbonate: 24.6 mEq/L — ABNORMAL HIGH (ref 20.0–24.0)
FIO2: 0.21
O2 SAT: 76.9 %
PCO2 VEN: 55.2 mmHg — AB (ref 45.0–50.0)
PH VEN: 7.271 (ref 7.250–7.300)
PO2 VEN: 48.5 mmHg — AB (ref 30.0–45.0)
Patient temperature: 98.6
TCO2: 22.3 mmol/L (ref 0–100)

## 2015-01-13 LAB — I-STAT TROPONIN, ED: Troponin i, poc: 0.01 ng/mL (ref 0.00–0.08)

## 2015-01-13 LAB — I-STAT CHEM 8, ED
BUN: 15 mg/dL (ref 6–20)
Calcium, Ion: 1.11 mmol/L — ABNORMAL LOW (ref 1.12–1.23)
Chloride: 106 mmol/L (ref 101–111)
Creatinine, Ser: 1.5 mg/dL — ABNORMAL HIGH (ref 0.61–1.24)
Glucose, Bld: 100 mg/dL — ABNORMAL HIGH (ref 65–99)
HEMATOCRIT: 46 % (ref 39.0–52.0)
HEMOGLOBIN: 15.6 g/dL (ref 13.0–17.0)
Potassium: 4.7 mmol/L (ref 3.5–5.1)
SODIUM: 141 mmol/L (ref 135–145)
TCO2: 22 mmol/L (ref 0–100)

## 2015-01-13 LAB — CBC WITH DIFFERENTIAL/PLATELET
BASOS ABS: 0 10*3/uL (ref 0.0–0.1)
BASOS PCT: 0 %
EOS ABS: 0 10*3/uL (ref 0.0–0.7)
Eosinophils Relative: 0 %
HCT: 44.6 % (ref 39.0–52.0)
Hemoglobin: 14.9 g/dL (ref 13.0–17.0)
Lymphocytes Relative: 9 %
Lymphs Abs: 1.9 10*3/uL (ref 0.7–4.0)
MCH: 30.8 pg (ref 26.0–34.0)
MCHC: 33.4 g/dL (ref 30.0–36.0)
MCV: 92.1 fL (ref 78.0–100.0)
MONO ABS: 1.7 10*3/uL — AB (ref 0.1–1.0)
Monocytes Relative: 8 %
NEUTROS ABS: 18.5 10*3/uL — AB (ref 1.7–7.7)
NEUTROS PCT: 83 %
Platelets: 330 10*3/uL (ref 150–400)
RBC: 4.84 MIL/uL (ref 4.22–5.81)
RDW: 13.2 % (ref 11.5–15.5)
WBC: 22.2 10*3/uL — ABNORMAL HIGH (ref 4.0–10.5)

## 2015-01-13 LAB — I-STAT CG4 LACTIC ACID, ED: Lactic Acid, Venous: 0.93 mmol/L (ref 0.5–2.0)

## 2015-01-13 MED ORDER — NALOXONE HCL 1 MG/ML IJ SOLN
2.0000 mg | Freq: Once | INTRAMUSCULAR | Status: AC
  Administered 2015-01-13: 2 mg via INTRAVENOUS
  Filled 2015-01-13: qty 2

## 2015-01-13 MED ORDER — ONDANSETRON HCL 4 MG/2ML IJ SOLN
4.0000 mg | Freq: Once | INTRAMUSCULAR | Status: AC
  Administered 2015-01-13: 4 mg via INTRAVENOUS
  Filled 2015-01-13: qty 2

## 2015-01-13 MED ORDER — SODIUM CHLORIDE 0.9 % IV BOLUS (SEPSIS)
1000.0000 mL | Freq: Once | INTRAVENOUS | Status: AC
  Administered 2015-01-13: 1000 mL via INTRAVENOUS

## 2015-01-13 MED ORDER — NALOXONE HCL 1 MG/ML IJ SOLN
1.0000 mg/h | INTRAMUSCULAR | Status: DC
  Administered 2015-01-14 (×3): 1 mg/h via INTRAVENOUS
  Filled 2015-01-13 (×4): qty 4

## 2015-01-13 NOTE — ED Notes (Signed)
Bed: RESB Expected date:  Expected time:  Means of arrival:  Comments: 55M OD/found apneic/post CPR/+seizure after narcan now A & O

## 2015-01-13 NOTE — ED Provider Notes (Signed)
CSN: [id number]     Arrival date & time 01/13/15  2300 History   By signing my name below, I, Arlan Organ, attest that this documentation has been prepared under the direction and in the presence of Laini Urick, MD. Electronically Signed: Arlan Organ, ED Scribe. 01/13/2015. 11:21 PM.   Chief Complaint  Patient presents with  . Drug Overdose   Patient is a 24 y.o. male presenting with Overdose. The history is provided by the EMS personnel. The history is limited by the condition of the patient. No language interpreter was used.  Drug Overdose This is a new problem. The current episode started 3 to 5 hours ago. The problem occurs constantly. The problem has not changed since onset.Pertinent negatives include no chest pain. Nothing aggravates the symptoms. Relieved by: intermittently by narcan and CPR. Treatments tried: narcan and CPR. The treatment provided moderate relief.    LEVEL 5 CAVEAT DUE TO PTS CONDITION   HPI Comments: Bradley Hanson brought in by EMS is a 24 y.o. male with a  PMHx of illicit drug abuse, anxiety, seizures, and depression who presents to the Emergency Department here after a drug overdose just prior to arrival. Per family, pt was found unresponsive at home. He admits to using "triple C's" (sudafed, acetaminophen, chlorpheniramine, and pseudoephedrine) and Mother states he has a history of same in the past. CPR initiated prior to EMS arrival. En route, pt was given 2 mg of  IN narcan. Grand mal seizure also reported afterwards. No post ictal stat noted by EMS. Pt was previously seen 10/17/2011 after previous Tramadol overdose at [facility].  After initial evaluation and dose of Narcan, pt admits to abusing heroin this evening.  After review of database, pt has had access to OxyContin 10 325, clonazepam, and Percocet 10 325 all filled in February of this year.  Past Medical History  Diagnosis Date  . Migraine   . Anxiety   . Pilonidal cyst   . Depression   . Pilonidal cyst  with abscess 03/20/2011  . Seizures [facility])    Past Surgical History  Procedure Laterality Date  . Cholecystectomy  2006  . Incise and drain abcess      pilo cyst  . Multiple extractions with alveoloplasty N/A 12/22/2013    Procedure: EXTRACTION OF TOOTH #30 WITH ALVEOLOPLASTY AND GROSS DEBRIDEMENT OF REMAINING TEETH ;  Surgeon: Charlynne Pander, DDS;  Location: [facility] OR;  Service: Oral Surgery;  Laterality: N/A;   Family History  Problem Relation Age of Onset  . Heart disease Paternal Grandmother    Social History  Substance Use Topics  . Smoking status: Current Every Day Smoker -- 0.25 packs/day    Types: Cigarettes  . Smokeless tobacco: None  . Alcohol Use: Yes    Review of Systems  Unable to perform ROS: Other  Cardiovascular: Negative for chest pain.      Allergies  Review of patient's allergies indicates no known allergies.  Home Medications   Prior to Admission medications   Medication Sig Start Date End Date Taking? Authorizing Provider  meloxicam (MOBIC) 7.5 MG tablet Take 1 tablet (7.5 mg total) by mouth daily. 12/23/13   Penny Pia, MD  Multiple Vitamin (MULTIVITAMIN WITH MINERALS) TABS tablet Take 1 tablet by mouth daily.    Historical Provider, MD  oxyCODONE (OXY IR/ROXICODONE) 5 MG immediate release tablet Take 1 tablet (5 mg total) by mouth every 6 (six) hours as needed for moderate pain. 12/23/13   Penny Pia, MD  Triage Vitals: Temp(Src) 97.7 F (36.5 C) (Axillary)  SpO2 98%   Physical Exam  Constitutional: He appears well-developed and well-nourished. No distress.  HENT:  Head: Normocephalic and atraumatic.  Mouth/Throat: Oropharynx is clear and moist.  Eyes: Conjunctivae and EOM are normal.  Pupils are responsive  Neck: Normal range of motion. Neck supple.  EJ on the left   Cardiovascular: Normal rate, regular rhythm, normal heart sounds and intact distal pulses.   Pulmonary/Chest: Effort normal and breath sounds normal. No stridor. No  respiratory distress. He has no wheezes. He has no rales. He exhibits no tenderness.  Abdominal: Soft. He exhibits no distension. There is no tenderness. There is no rebound and no guarding.  Acute bowel sounds  Musculoskeletal: Normal range of motion.  No clonus No rigidity   Neurological: He is alert. He has normal reflexes.  Babinski seems to be down going on the L and R  Skin: Skin is warm and dry.  Psychiatric:  Somnolent unable to assess at this time  Nursing note and vitals reviewed.   ED Course  Procedures (including critical care time)  DIAGNOSTIC STUDIES: Oxygen Saturation is 98% on RA, Normal by my interpretation.    COORDINATION OF CARE: 11:11 PM- Will order acetaminophen level, salicylate level, urine rapid drug screen, CBC, i-stat chem 8, ethanol, and EKG. Discussed treatment plan with pt at bedside and pt agreed to plan.     Labs Review Labs Reviewed  CBC WITH DIFFERENTIAL/PLATELET - Abnormal; Notable for the following:    WBC 22.2 (*)    Neutro Abs 18.5 (*)    Monocytes Absolute 1.7 (*)    All other components within normal limits  ACETAMINOPHEN LEVEL - Abnormal; Notable for the following:    Acetaminophen (Tylenol), Serum <10 (*)    All other components within normal limits  URINE RAPID DRUG SCREEN, HOSP PERFORMED - Abnormal; Notable for the following:    Opiates POSITIVE (*)    Benzodiazepines POSITIVE (*)    Tetrahydrocannabinol POSITIVE (*)    All other components within normal limits  PHENYTOIN LEVEL, TOTAL - Abnormal; Notable for the following:    Phenytoin Lvl <2.5 (*)    All other components within normal limits  BLOOD GAS, VENOUS - Abnormal; Notable for the following:    pCO2, Ven 55.2 (*)    pO2, Ven 48.5 (*)    Bicarbonate 24.6 (*)    Acid-base deficit 2.8 (*)    All other components within normal limits  HEPATIC FUNCTION PANEL - Abnormal; Notable for the following:    AST 131 (*)    ALT 213 (*)    All other components within normal limits   I-STAT CHEM 8, ED - Abnormal; Notable for the following:    Creatinine, Ser 1.50 (*)    Glucose, Bld 100 (*)    Calcium, Ion 1.11 (*)    All other components within normal limits  ETHANOL  SALICYLATE LEVEL  I-STAT VENOUS BLOOD GAS, ED  I-STAT TROPOININ, ED  I-STAT CG4 LACTIC ACID, ED  I-STAT CG4 LACTIC ACID, ED    Imaging Review Ct Head Wo Contrast  01/14/2015   CLINICAL DATA:  Unresponsive after overdose  EXAM: CT HEAD WITHOUT CONTRAST  TECHNIQUE: Contiguous axial images were obtained from the base of the skull through the vertex without intravenous contrast.  COMPARISON:  03/12/2009  FINDINGS: There is no intracranial hemorrhage, mass or evidence of acute infarction. There is no extra-axial fluid collection. Gray matter and white matter appear normal.  Cerebral volume is normal for age. Brainstem and posterior fossa are unremarkable. The CSF spaces appear normal.  The bony structures are intact. The visible portions of the paranasal sinuses are clear.  IMPRESSION: Normal brain   Electronically Signed   By: Ellery Plunk M.D.   On: 01/14/2015 01:38   Dg Chest Portable 1 View  01/14/2015   CLINICAL DATA:  Patient found unresponsive at home, after mixed drug overdose.  EXAM: PORTABLE CHEST 1 VIEW  COMPARISON:  03/12/2009  FINDINGS: Cardiomediastinal silhouette is normal. Mediastinal contours appear intact.  There is no evidence of focal airspace consolidation, pleural effusion or pneumothorax.  Osseous structures are without acute abnormality. Soft tissues are grossly normal.  IMPRESSION: No radiographic evidence of acute cardiopulmonary abnormality.   Electronically Signed   By: Ted Mcalpine M.D.   On: 01/14/2015 00:09   I have personally reviewed and evaluated these images and lab results as part of my medical decision-making.   EKG Interpretation   Date/Time:  Friday January 13 2015 23:03:43 EDT Ventricular Rate:  105 PR Interval:  168 QRS Duration: 84 QT Interval:   324 QTC Calculation: 428 R Axis:   63 Text Interpretation:  Sinus tachycardia Confirmed by [facility]  MD,  Morene Antu ([id number]) on 01/13/2015 11:09:20 PM      MDM   Final diagnoses:  None    Likely an intentional OD as has a h/o same.  He will need to be cleared by psych following medical stabilization Results for orders placed or performed during the hospital encounter of 01/13/15  CBC with Differential/Platelet  Result Value Ref Range   WBC 22.2 (H) 4.0 - 10.5 K/uL   RBC 4.84 4.22 - 5.81 MIL/uL   Hemoglobin 14.9 13.0 - 17.0 g/dL   HCT 60.4 54.0 - 98.1 %   MCV 92.1 78.0 - 100.0 fL   MCH 30.8 26.0 - 34.0 pg   MCHC 33.4 30.0 - 36.0 g/dL   RDW 19.1 47.8 - 29.5 %   Platelets 330 150 - 400 K/uL   Neutrophils Relative % 83 %   Neutro Abs 18.5 (H) 1.7 - 7.7 K/uL   Lymphocytes Relative 9 %   Lymphs Abs 1.9 0.7 - 4.0 K/uL   Monocytes Relative 8 %   Monocytes Absolute 1.7 (H) 0.1 - 1.0 K/uL   Eosinophils Relative 0 %   Eosinophils Absolute 0.0 0.0 - 0.7 K/uL   Basophils Relative 0 %   Basophils Absolute 0.0 0.0 - 0.1 K/uL  Ethanol  Result Value Ref Range   Alcohol, Ethyl (B) <5 <5 mg/dL  Acetaminophen level  Result Value Ref Range   Acetaminophen (Tylenol), Serum <10 (L) 10 - 30 ug/mL  Salicylate level  Result Value Ref Range   Salicylate Lvl <4.0 2.8 - 30.0 mg/dL  Urine rapid drug screen (hosp performed)  Result Value Ref Range   Opiates POSITIVE (A) NONE DETECTED   Cocaine NONE DETECTED NONE DETECTED   Benzodiazepines POSITIVE (A) NONE DETECTED   Amphetamines NONE DETECTED NONE DETECTED   Tetrahydrocannabinol POSITIVE (A) NONE DETECTED   Barbiturates NONE DETECTED NONE DETECTED  Phenytoin level, total  Result Value Ref Range   Phenytoin Lvl <2.5 (L) 10.0 - 20.0 ug/mL  Blood gas, venous  Result Value Ref Range   FIO2 0.21    pH, Ven 7.271 7.250 - 7.300   pCO2, Ven 55.2 (H) 45.0 - 50.0 mmHg   pO2, Ven 48.5 (H) 30.0 - 45.0 mmHg   Bicarbonate 24.6 (H) 20.0 -  24.0 mEq/L    TCO2 22.3 0 - 100 mmol/L   Acid-base deficit 2.8 (H) 0.0 - 2.0 mmol/L   O2 Saturation 76.9 %   Patient temperature 98.6    Collection site VEIN    Drawn by Oneita Jolly, RN    Sample type VEIN   Hepatic function panel  Result Value Ref Range   Total Protein 7.2 6.5 - 8.1 g/dL   Albumin 4.5 3.5 - 5.0 g/dL   AST 989 (H) 15 - 41 U/L   ALT 213 (H) 17 - 63 U/L   Alkaline Phosphatase 57 38 - 126 U/L   Total Bilirubin 1.0 0.3 - 1.2 mg/dL   Bilirubin, Direct 0.1 0.1 - 0.5 mg/dL   Indirect Bilirubin 0.9 0.3 - 0.9 mg/dL  CBC  Result Value Ref Range   WBC 14.1 (H) 4.0 - 10.5 K/uL   RBC 4.06 (L) 4.22 - 5.81 MIL/uL   Hemoglobin 12.2 (L) 13.0 - 17.0 g/dL   HCT 21.1 (L) 94.1 - 74.0 %   MCV 94.6 78.0 - 100.0 fL   MCH 30.0 26.0 - 34.0 pg   MCHC 31.8 30.0 - 36.0 g/dL   RDW 81.4 48.1 - 85.6 %   Platelets 241 150 - 400 K/uL  Protime-INR  Result Value Ref Range   Prothrombin Time 15.0 11.6 - 15.2 seconds   INR 1.16 0.00 - 1.49  Blood gas, venous  Result Value Ref Range   FIO2 0.21    pH, Ven 7.317 (H) 7.250 - 7.300   pCO2, Ven 48.7 45.0 - 50.0 mmHg   pO2, Ven 25.3 (LL) 30.0 - 45.0 mmHg   Bicarbonate 24.4 (H) 20.0 - 24.0 mEq/L   TCO2 22.8 0 - 100 mmol/L   Acid-base deficit 1.7 0.0 - 2.0 mmol/L   O2 Saturation 41.4 %   Patient temperature 97.7    Collection site VEIN    Drawn by [id number]    Sample type VEIN   I-Stat Chem 8, ED  Result Value Ref Range   Sodium 141 135 - 145 mmol/L   Potassium 4.7 3.5 - 5.1 mmol/L   Chloride 106 101 - 111 mmol/L   BUN 15 6 - 20 mg/dL   Creatinine, Ser 2.63 (H) 0.61 - 1.24 mg/dL   Glucose, Bld 785 (H) 65 - 99 mg/dL   Calcium, Ion 8.85 (L) 1.12 - 1.23 mmol/L   TCO2 22 0 - 100 mmol/L   Hemoglobin 15.6 13.0 - 17.0 g/dL   HCT 02.7 74.1 - 28.7 %  I-stat troponin, ED  Result Value Ref Range   Troponin i, poc 0.01 0.00 - 0.08 ng/mL   Comment 3          I-Stat CG4 Lactic Acid, ED  Result Value Ref Range   Lactic Acid, Venous 0.93 0.5 - 2.0 mmol/L  I-Stat  CG4 Lactic Acid, ED  Result Value Ref Range   Lactic Acid, Venous 0.57 0.5 - 2.0 mmol/L   Ct Head Wo Contrast  01/14/2015   CLINICAL DATA:  Unresponsive after overdose  EXAM: CT HEAD WITHOUT CONTRAST  TECHNIQUE: Contiguous axial images were obtained from the base of the skull through the vertex without intravenous contrast.  COMPARISON:  03/12/2009  FINDINGS: There is no intracranial hemorrhage, mass or evidence of acute infarction. There is no extra-axial fluid collection. Gray matter and white matter appear normal. Cerebral volume is normal for age. Brainstem and posterior fossa are unremarkable. The CSF spaces appear normal.  The bony structures are  intact. The visible portions of the paranasal sinuses are clear.  IMPRESSION: Normal brain   Electronically Signed   By: Ellery Plunk M.D.   On: 01/14/2015 01:38   Dg Chest Portable 1 View  01/14/2015   CLINICAL DATA:  Patient found unresponsive at home, after mixed drug overdose.  EXAM: PORTABLE CHEST 1 VIEW  COMPARISON:  03/12/2009  FINDINGS: Cardiomediastinal silhouette is normal. Mediastinal contours appear intact.  There is no evidence of focal airspace consolidation, pleural effusion or pneumothorax.  Osseous structures are without acute abnormality. Soft tissues are grossly normal.  IMPRESSION: No radiographic evidence of acute cardiopulmonary abnormality.   Electronically Signed   By: Ted Mcalpine M.D.   On: 01/14/2015 00:09    Medications  naloxone (NARCAN) 4 mg in dextrose 5 % 250 mL infusion (1 mg/hr Intravenous New Bag/Given 01/14/15 0107)  0.9 %  sodium chloride infusion (not administered)  enoxaparin (LOVENOX) injection 40 mg (not administered)  dextrose 5 %-0.45 % sodium chloride infusion (not administered)  sodium chloride 0.9 % bolus 1,000 mL (0 mLs Intravenous Stopped 01/14/15 0117)  ondansetron (ZOFRAN) injection 4 mg (4 mg Intravenous Given 01/13/15 2325)  naloxone (NARCAN) injection 2 mg (2 mg Intravenous Given 01/13/15  2325)  sodium chloride 0.9 % bolus 1,000 mL (0 mLs Intravenous Stopped 01/14/15 0333)   MDM Reviewed: previous chart, nursing note and vitals Reviewed previous: labs Interpretation: labs, ECG, x-ray and CT scan (elevated white count positive opiates in UDS and benzos.  Negative head CT by me and negative CXR by me) Total time providing critical care: 75-105 minutes. This excludes time spent performing separately reportable procedures and services. Consults: critical care (and triad)  CRITICAL CARE Performed by: Jasmine Awe Total critical care time: 75 minutes Critical care time was exclusive of separately billable procedures and treating other patients. Critical care was necessary to treat or prevent imminent or life-threatening deterioration. Critical care was time spent personally by me on the following activities: development of treatment plan with patient and/or surrogate as well as nursing, discussions with consultants, evaluation of patient's response to treatment, examination of patient, obtaining history from patient or surrogate, ordering and performing treatments and interventions, ordering and review of laboratory studies, ordering and review of radiographic studies, pulse oximetry and re-evaluation of patient's condition.   I, Araly Kaas-RASCH,Nabila Albarracin K, personally performed the services described in this documentation. All medical record entries made by the scribe were at my direction and in my presence.  I have reviewed the chart and discharge instructions and agree that the record reflects my personal performance and is accurate and complete. Allah Reason-RASCH,Jatavion Peaster K.  10/08/2016Deno Etienne AM.     Leea Rambeau, MD 01/14/15 [phone]

## 2015-01-13 NOTE — ED Notes (Addendum)
Per EMS pt was found unresponsive by family who initiated CPR.  Pt admits to doing "triple c's" (cough, cold, congestion).  Upon EMS arrival pt had agonal respirations.  NPA placed, pt bagged and O2 improved to 100%.  Pt given 2 mg IN narcan. After pt received narcan py became rigid and appeared to be having a grand mal seizure.  No post ictal state noted by EMS.

## 2015-01-14 ENCOUNTER — Emergency Department (HOSPITAL_COMMUNITY): Payer: Self-pay

## 2015-01-14 ENCOUNTER — Encounter (HOSPITAL_COMMUNITY): Payer: Self-pay | Admitting: Emergency Medicine

## 2015-01-14 DIAGNOSIS — I469 Cardiac arrest, cause unspecified: Secondary | ICD-10-CM

## 2015-01-14 DIAGNOSIS — T50904D Poisoning by unspecified drugs, medicaments and biological substances, undetermined, subsequent encounter: Secondary | ICD-10-CM

## 2015-01-14 DIAGNOSIS — G9340 Encephalopathy, unspecified: Secondary | ICD-10-CM

## 2015-01-14 DIAGNOSIS — T50902A Poisoning by unspecified drugs, medicaments and biological substances, intentional self-harm, initial encounter: Secondary | ICD-10-CM | POA: Diagnosis present

## 2015-01-14 LAB — BASIC METABOLIC PANEL
ANION GAP: 5 (ref 5–15)
BUN: 15 mg/dL (ref 6–20)
CO2: 24 mmol/L (ref 22–32)
Calcium: 7.9 mg/dL — ABNORMAL LOW (ref 8.9–10.3)
Chloride: 111 mmol/L (ref 101–111)
Creatinine, Ser: 1.04 mg/dL (ref 0.61–1.24)
GLUCOSE: 117 mg/dL — AB (ref 65–99)
POTASSIUM: 4.2 mmol/L (ref 3.5–5.1)
Sodium: 140 mmol/L (ref 135–145)

## 2015-01-14 LAB — PHOSPHORUS
PHOSPHORUS: 3.5 mg/dL (ref 2.5–4.6)
Phosphorus: 4 mg/dL (ref 2.5–4.6)

## 2015-01-14 LAB — RAPID URINE DRUG SCREEN, HOSP PERFORMED
Amphetamines: NOT DETECTED
Barbiturates: NOT DETECTED
Benzodiazepines: POSITIVE — AB
COCAINE: NOT DETECTED
OPIATES: POSITIVE — AB
Tetrahydrocannabinol: POSITIVE — AB

## 2015-01-14 LAB — MRSA PCR SCREENING: MRSA by PCR: NEGATIVE

## 2015-01-14 LAB — CBC
HCT: 38.4 % — ABNORMAL LOW (ref 39.0–52.0)
HEMATOCRIT: 38 % — AB (ref 39.0–52.0)
HEMOGLOBIN: 12.2 g/dL — AB (ref 13.0–17.0)
Hemoglobin: 12.2 g/dL — ABNORMAL LOW (ref 13.0–17.0)
MCH: 30 pg (ref 26.0–34.0)
MCH: 30.1 pg (ref 26.0–34.0)
MCHC: 31.8 g/dL (ref 30.0–36.0)
MCHC: 32.1 g/dL (ref 30.0–36.0)
MCV: 94.6 fL (ref 78.0–100.0)
MCV: 93.8 fL (ref 78.0–100.0)
PLATELETS: 225 10*3/uL (ref 150–400)
Platelets: 241 10*3/uL (ref 150–400)
RBC: 4.06 MIL/uL — AB (ref 4.22–5.81)
RBC: 4.05 MIL/uL — AB (ref 4.22–5.81)
RDW: 13.5 % (ref 11.5–15.5)
RDW: 13.5 % (ref 11.5–15.5)
WBC: 14.1 10*3/uL — ABNORMAL HIGH (ref 4.0–10.5)
WBC: 13.4 10*3/uL — AB (ref 4.0–10.5)

## 2015-01-14 LAB — BLOOD GAS, VENOUS
ACID-BASE DEFICIT: 1.7 mmol/L (ref 0.0–2.0)
BICARBONATE: 24.4 meq/L — AB (ref 20.0–24.0)
DRAWN BY: 235321
FIO2: 0.21
O2 SAT: 41.4 %
PO2 VEN: 25.3 mmHg — AB (ref 30.0–45.0)
Patient temperature: 97.7
TCO2: 22.8 mmol/L (ref 0–100)
pCO2, Ven: 48.7 mmHg (ref 45.0–50.0)
pH, Ven: 7.317 — ABNORMAL HIGH (ref 7.250–7.300)

## 2015-01-14 LAB — COMPREHENSIVE METABOLIC PANEL
ALBUMIN: 3.7 g/dL (ref 3.5–5.0)
ALT: 163 U/L — ABNORMAL HIGH (ref 17–63)
AST: 102 U/L — AB (ref 15–41)
Alkaline Phosphatase: 46 U/L (ref 38–126)
Anion gap: 5 (ref 5–15)
BUN: 15 mg/dL (ref 6–20)
CHLORIDE: 111 mmol/L (ref 101–111)
CO2: 26 mmol/L (ref 22–32)
Calcium: 7.8 mg/dL — ABNORMAL LOW (ref 8.9–10.3)
Creatinine, Ser: 1.23 mg/dL (ref 0.61–1.24)
GFR calc Af Amer: >60 mL/min (ref >60)
GFR calc non Af Amer: >60 mL/min (ref >60)
GLUCOSE: 106 mg/dL — AB (ref 65–99)
POTASSIUM: 4.3 mmol/L (ref 3.5–5.1)
Sodium: 142 mmol/L (ref 135–145)
Total Bilirubin: 0.8 mg/dL (ref 0.3–1.2)
Total Protein: 5.9 g/dL — ABNORMAL LOW (ref 6.5–8.1)

## 2015-01-14 LAB — LIPASE, BLOOD: Lipase: 23 U/L (ref 22–51)

## 2015-01-14 LAB — I-STAT CG4 LACTIC ACID, ED: LACTIC ACID, VENOUS: 0.57 mmol/L (ref 0.5–2.0)

## 2015-01-14 LAB — HEPATIC FUNCTION PANEL
ALT: 213 U/L — AB (ref 17–63)
AST: 131 U/L — ABNORMAL HIGH (ref 15–41)
Albumin: 4.5 g/dL (ref 3.5–5.0)
Alkaline Phosphatase: 57 U/L (ref 38–126)
BILIRUBIN DIRECT: 0.1 mg/dL (ref 0.1–0.5)
BILIRUBIN INDIRECT: 0.9 mg/dL (ref 0.3–0.9)
Total Bilirubin: 1 mg/dL (ref 0.3–1.2)
Total Protein: 7.2 g/dL (ref 6.5–8.1)

## 2015-01-14 LAB — MAGNESIUM
MAGNESIUM: 1.9 mg/dL (ref 1.7–2.4)
Magnesium: 2 mg/dL (ref 1.7–2.4)

## 2015-01-14 LAB — SALICYLATE LEVEL

## 2015-01-14 LAB — ACETAMINOPHEN LEVEL

## 2015-01-14 LAB — PROTIME-INR
INR: 1.16 (ref 0.00–1.49)
Prothrombin Time: 15 seconds (ref 11.6–15.2)

## 2015-01-14 LAB — OSMOLALITY: OSMOLALITY: 295 mosm/kg (ref 275–300)

## 2015-01-14 LAB — ETHANOL

## 2015-01-14 LAB — PHENYTOIN LEVEL, TOTAL

## 2015-01-14 MED ORDER — NALOXONE HCL 0.4 MG/ML IJ SOLN
INTRAMUSCULAR | Status: AC
  Filled 2015-01-14: qty 1

## 2015-01-14 MED ORDER — SODIUM CHLORIDE 0.9 % IV SOLN
250.0000 mL | INTRAVENOUS | Status: DC | PRN
  Administered 2015-01-14: 250 mL via INTRAVENOUS

## 2015-01-14 MED ORDER — ENOXAPARIN SODIUM 40 MG/0.4ML ~~LOC~~ SOLN
40.0000 mg | Freq: Every day | SUBCUTANEOUS | Status: DC
  Administered 2015-01-14 – 2015-01-16 (×3): 40 mg via SUBCUTANEOUS
  Filled 2015-01-14 (×3): qty 0.4

## 2015-01-14 MED ORDER — INFLUENZA VAC SPLIT QUAD 0.5 ML IM SUSY
0.5000 mL | PREFILLED_SYRINGE | INTRAMUSCULAR | Status: AC
  Administered 2015-01-15: 0.5 mL via INTRAMUSCULAR
  Filled 2015-01-14 (×3): qty 0.5

## 2015-01-14 MED ORDER — SODIUM CHLORIDE 0.9 % IV BOLUS (SEPSIS)
1000.0000 mL | Freq: Once | INTRAVENOUS | Status: AC
  Administered 2015-01-14: 1000 mL via INTRAVENOUS

## 2015-01-14 MED ORDER — DEXTROSE-NACL 5-0.45 % IV SOLN
INTRAVENOUS | Status: AC
  Administered 2015-01-14: 06:00:00 via INTRAVENOUS

## 2015-01-14 MED ORDER — NALOXONE HCL 1 MG/ML IJ SOLN
1.0000 mg/h | INTRAMUSCULAR | Status: DC
  Administered 2015-01-14 – 2015-01-15 (×4): 1 mg/h via INTRAVENOUS
  Filled 2015-01-14 (×4): qty 4

## 2015-01-14 MED ORDER — NALOXONE HCL 1 MG/ML IJ SOLN
1.0000 mg | INTRAMUSCULAR | Status: DC | PRN
  Administered 2015-01-14: 1 mg via INTRAVENOUS
  Filled 2015-01-14 (×3): qty 2

## 2015-01-14 NOTE — Consult Note (Addendum)
PULMONARY / CRITICAL CARE MEDICINE   Name: Bradley Hanson MRN: [id number] DOB: [birth year]    ADMISSION DATE:  01/13/2015 CHIEF COMPLAINT:  Multi-drug OD  HISTORY OF PRESENT ILLNESS:   24 yo male with h/o HTN and narcotic abuse (heroin and oral narcotics).  Last night at approximately 730pm he shot heroin and took coricidin (Chlorpheniramine/dextromethorphan/APAP).  He is unclear on the quantity he took.  He states that he was not trying to hurt himself and has not had SI/HI.  He states that the next thing he recalls is being in the ambulance.  Per EMS reports, the patient seized and pulses were reportedly lost, CPR he was given narcan with improvement.  However upon arrival to the ED at [facility] he was still in respiratory distress, with marked encephalopathy and acute hypoxemia.  He was placed on a narcan gtt with improvement of his mental status and his hypoxemia.   PAST MEDICAL HISTORY :   has a past medical history of Migraine; Anxiety; Pilonidal cyst; Depression; Pilonidal cyst with abscess (03/20/2011); Seizures (HCC); and Overdose.  has past surgical history that includes Cholecystectomy (2006); Incise and drain abcess; and Multiple extractions with alveoloplasty (N/A, 12/22/2013). Prior to Admission medications   Medication Sig Start Date End Date Taking? Authorizing Provider  lisinopril (PRINIVIL,ZESTRIL) 10 MG tablet Take 10 mg by mouth daily.   Yes Historical Provider, MD  meloxicam (MOBIC) 7.5 MG tablet Take 1 tablet (7.5 mg total) by mouth daily. Patient not taking: Reported on 01/14/2015 12/23/13   Penny Pia, MD  oxyCODONE (OXY IR/ROXICODONE) 5 MG immediate release tablet Take 1 tablet (5 mg total) by mouth every 6 (six) hours as needed for moderate pain. Patient not taking: Reported on 01/14/2015 12/23/13   Penny Pia, MD   No Known Allergies  FAMILY HISTORY:  indicated that his mother is alive. He indicated that his father is alive.  SOCIAL HISTORY:  reports that he has been  smoking Cigarettes.  He has been smoking about 0.25 packs per day. He does not have any smokeless tobacco history on file. He reports that he drinks alcohol. He reports that he does not use illicit drugs.  REVIEW OF SYSTEMS:   Denies: fevers, chills, nausea, vomiting diarrhea, night sweats, SI/HI Reports: fatigue, somnolence  SUBJECTIVE:   VITAL SIGNS: Temp:  [97.7 F (36.5 C)] 97.7 F (36.5 C) (10/07 2305) Pulse Rate:  [80-90] 80 (10/08 0300) Resp:  [12-19] 14 (10/08 0300) BP: (118-135)/(64-91) 118/67 mmHg (10/08 0300) SpO2:  [91 %-98 %] 94 % (10/08 0300) HEMODYNAMICS:   VENTILATOR SETTINGS:   INTAKE / OUTPUT: No intake or output data in the 24 hours ending 01/14/15 0323  PHYSICAL EXAMINATION: General:  NAD, somnolent but alert, oriented Neuro:  AAOx3, no focal deficits, CN II-XII, no asterixis  HEENT:  NCAT, MM dry,  Cardiovascular:  RRR, S1/s2, no m/r/g Lungs:  CTA b/l no m/r/r Abdomen:  Normal bowel sounds, soft, non-tender non distended   LABS:  CBC  Recent Labs Lab 01/13/15 2316 01/13/15 2333  WBC 22.2*  --   HGB 14.9 15.6  HCT 44.6 46.0  PLT 330  --    Coag's No results for input(s): APTT, INR in the last 168 hours. BMET  Recent Labs Lab 01/13/15 2333  NA 141  K 4.7  CL 106  BUN 15  CREATININE 1.50*  GLUCOSE 100*   Electrolytes No results for input(s): CALCIUM, MG, PHOS in the last 168 hours. Sepsis Markers  Recent Labs Lab 01/13/15 2334  01/14/15 0241  LATICACIDVEN 0.93 0.57   ABG No results for input(s): PHART, PCO2ART, PO2ART in the last 168 hours. Liver Enzymes  Recent Labs Lab 01/14/15 0035  AST 131*  ALT 213*  ALKPHOS 57  BILITOT 1.0  ALBUMIN 4.5   Cardiac Enzymes No results for input(s): TROPONINI, PROBNP in the last 168 hours. Glucose No results for input(s): GLUCAP in the last 168 hours.  Imaging Ct Head Wo Contrast  01/14/2015   CLINICAL DATA:  Unresponsive after overdose  EXAM: CT HEAD WITHOUT CONTRAST   TECHNIQUE: Contiguous axial images were obtained from the base of the skull through the vertex without intravenous contrast.  COMPARISON:  03/12/2009  FINDINGS: There is no intracranial hemorrhage, mass or evidence of acute infarction. There is no extra-axial fluid collection. Gray matter and white matter appear normal. Cerebral volume is normal for age. Brainstem and posterior fossa are unremarkable. The CSF spaces appear normal.  The bony structures are intact. The visible portions of the paranasal sinuses are clear.  IMPRESSION: Normal brain   Electronically Signed   By: Ellery Plunk M.D.   On: 01/14/2015 01:38   Dg Chest Portable 1 View  01/14/2015   CLINICAL DATA:  Patient found unresponsive at home, after mixed drug overdose.  EXAM: PORTABLE CHEST 1 VIEW  COMPARISON:  03/12/2009  FINDINGS: Cardiomediastinal silhouette is normal. Mediastinal contours appear intact.  There is no evidence of focal airspace consolidation, pleural effusion or pneumothorax.  Osseous structures are without acute abnormality. Soft tissues are grossly normal.  IMPRESSION: No radiographic evidence of acute cardiopulmonary abnormality.   Electronically Signed   By: Ted Mcalpine M.D.   On: 01/14/2015 00:09     ASSESSMENT / PLAN: 24 yo male with multiple drug OD, currently on narcan drip. He is hemodynamically stable and not in any acute respiratory distress.  His additional tox screening does not require more than supportive care at this time.  PULMONARY A: Acute hypoxemic respiratory failure 2/2 mulit-drug OD primarily IV narcotics. P:    - No indication for intubation at this time  - Continue supplemental O2 PRN to maintain sats >90  - Cont narcan gtt @ /hr for until AM  CARDIOVASCULAR A: Post arrest. S/p CPR x .  2/2 hypoxemia vs hypercapnea.  Good return of neurologic function, now asymptomatic. P:   - Monitor and supportive care  - no indication for hypothermic protocol  RENAL A:  AKI  likley 2/2 arrest P:    - Gentle hydration tonight  - recheck in AM  INFECTIOUS A:  Leukocytosis likely stress reaction from arrest.  No suspected sources at present time.  CXR clear, though aspiration pneumonia vs pneumonitis would be most plausible if he developed symptoms. P:    - Recheck CBC in AM  GASTROINTESTINAL: A: Transamonitis likely 2/2 post arrest P:  - Recheck LFTs in AM  Multi-drug OD: Coricidin comes in multiple preparations some with and some without APAP, initial levels of APAP and ASA negative, as was his EtOH levels.  He has no SI/HI  - Recheck APAP now  - Check serum osmolar Gap  - cont narcan gtt   FAMILY  - Updates:  Will call tomorrow.  Total critical care time: 45 min  Critical care time was exclusive of separately billable procedures and treating other patients.  Critical care was necessary to treat or prevent imminent or life-threatening deterioration.  Critical care was time spent personally by me on the following activities: development of treatment plan  with patient and/or surrogate as well as nursing, discussions with consultants, evaluation of patient's response to treatment, examination of patient, obtaining history from patient or surrogate, ordering and performing treatments and interventions, ordering and review of laboratory studies, ordering and review of radiographic studies, pulse oximetry and re-evaluation of patient's condition.   Galvin Proffer, DO., MS [facility]   Pulmonary and Critical Care Medicine [facility] Pager: [phone]  01/14/2015, 3:23 AM

## 2015-01-14 NOTE — ED Notes (Signed)
Bed: ZO10 Expected date:  Expected time:  Means of arrival:  Comments: EMS 54F SI

## 2015-01-14 NOTE — Progress Notes (Signed)
eLink Physician-Brief Progress Note Patient Name: Bradley Hanson DOB: 04-24-1990 MRN: [id number]   Date of Service  01/14/2015  HPI/Events of Note  Decreased LOC. Opiod OD.  eICU Interventions  Will order: 1. Narcan 1 mg IV now. 2. Restart Narcan IV infusion at 1 mg/hour.     Intervention Category Major Interventions: Change in mental status - evaluation and management  Sommer,Steven Eugene 01/14/2015, 4:33 PM

## 2015-01-14 NOTE — Progress Notes (Addendum)
OD on benzo, heroine and THC, on narcan drip.  Keep on narcan drip.  Start diet.  PT and OD.  Spoke with patient, educated regarding combination of medications and their side effects as the combination can be lethal.  Discussed life choices at length.  Chest is sore but not dislocated post CPR.  Will hold in the ICU until able to get off narcan drip.   The patient is critically ill with multiple organ systems failure and requires high complexity decision making for assessment and support, frequent evaluation and titration of therapies, application of advanced monitoring technologies and extensive interpretation of multiple databases.   Critical Care Time devoted to patient care services described in this note is  45  Minutes. This time reflects time of care of this signee Dr Koren Bound. This critical care time does not reflect procedure time, or teaching time or supervisory time of PA/NP/Med student/Med Resident etc but could involve care discussion time.  Alyson Reedy, M.D. [facility] Pulmonary/Critical Care Medicine. Pager: [phone]. After hours pager: [phone].

## 2015-01-15 DIAGNOSIS — I952 Hypotension due to drugs: Secondary | ICD-10-CM

## 2015-01-15 DIAGNOSIS — J9601 Acute respiratory failure with hypoxia: Secondary | ICD-10-CM

## 2015-01-15 LAB — CBC
HCT: 37.3 % — ABNORMAL LOW (ref 39.0–52.0)
HEMOGLOBIN: 12.3 g/dL — AB (ref 13.0–17.0)
MCH: 30.9 pg (ref 26.0–34.0)
MCHC: 33 g/dL (ref 30.0–36.0)
MCV: 93.7 fL (ref 78.0–100.0)
Platelets: 208 10*3/uL (ref 150–400)
RBC: 3.98 MIL/uL — ABNORMAL LOW (ref 4.22–5.81)
RDW: 13.4 % (ref 11.5–15.5)
WBC: 10 10*3/uL (ref 4.0–10.5)

## 2015-01-15 LAB — BASIC METABOLIC PANEL
Anion gap: 4 — ABNORMAL LOW (ref 5–15)
BUN: 8 mg/dL (ref 6–20)
CALCIUM: 8.5 mg/dL — AB (ref 8.9–10.3)
CHLORIDE: 110 mmol/L (ref 101–111)
CO2: 28 mmol/L (ref 22–32)
CREATININE: 0.95 mg/dL (ref 0.61–1.24)
GFR calc non Af Amer: >60 mL/min (ref >60)
Glucose, Bld: 114 mg/dL — ABNORMAL HIGH (ref 65–99)
Potassium: 3.9 mmol/L (ref 3.5–5.1)
SODIUM: 142 mmol/L (ref 135–145)

## 2015-01-15 LAB — MAGNESIUM: MAGNESIUM: 2.1 mg/dL (ref 1.7–2.4)

## 2015-01-15 LAB — PHOSPHORUS: PHOSPHORUS: 2.6 mg/dL (ref 2.5–4.6)

## 2015-01-15 NOTE — Progress Notes (Signed)
PULMONARY / CRITICAL CARE MEDICINE   Name: Bradley Hanson MRN: [id number] DOB: 06/28/1990    ADMISSION DATE:  01/13/2015 CHIEF COMPLAINT:  Multi-drug OD  HISTORY OF PRESENT ILLNESS:   24 yo male with h/o HTN and narcotic abuse (heroin and oral narcotics).  Last night at approximately 730pm he shot heroin and took coricidin (Chlorpheniramine/dextromethorphan/APAP).  He is unclear on the quantity he took.  He states that he was not trying to hurt himself and has not had SI/HI.  He states that the next thing he recalls is being in the ambulance.  Per EMS reports, the patient seized and pulses were reportedly lost, CPR he was given narcan with improvement.  However upon arrival to the ED at [facility] he was still in respiratory distress, with marked encephalopathy and acute hypoxemia.  He was placed on a narcan gtt with improvement of his mental status and his hypoxemia.   SUBJECTIVE: LOC earlier today and yesterday when narcan was stopped, now remains on narcan and is perfectly awake and alert.  VITAL SIGNS: Temp:  [98.1 F (36.7 C)-99 F (37.2 C)] 98.5 F (36.9 C) (10/09 0800) Pulse Rate:  [73-92] 85 (10/09 0800) Resp:  [0-22] 16 (10/09 0800) BP: (108-137)/(59-83) 119/68 mmHg (10/09 0800) SpO2:  [94 %-100 %] 97 % (10/09 0800) Weight:  [116.3 kg (256 lb 6.3 oz)] 116.3 kg (256 lb 6.3 oz) (10/09 0504) HEMODYNAMICS:   VENTILATOR SETTINGS:   INTAKE / OUTPUT:  Intake/Output Summary (Last 24 hours) at 01/15/15 1243 Last data filed at 01/15/15 1100  Gross per 24 hour  Intake 2071.55 ml  Output   5450 ml  Net -3378.45 ml    PHYSICAL EXAMINATION: General:  NAD, awake, NAD Neuro:  AAOx3, no focal deficits, CN II-XII, no asterixis  HEENT:  NCAT, MM dry,  Cardiovascular:  RRR, S1/s2, no m/r/g Lungs:  CTA b/l no m/r/r Abdomen:  Normal bowel sounds, soft, non-tender non distended Ext: intact Skin: intact.  LABS:  CBC  Recent Labs Lab 01/14/15 0413 01/14/15 0527 01/15/15 0339  WBC  14.1* 13.4* 10.0  HGB 12.2* 12.2* 12.3*  HCT 38.4* 38.0* 37.3*  PLT 241 225 208   Coag's  Recent Labs Lab 01/14/15 0413  INR 1.16   BMET  Recent Labs Lab 01/14/15 0413 01/14/15 0527 01/15/15 0339  NA 142 140 142  K 4.3 4.2 3.9  CL 111 111 110  CO2 BUN CREATININE 1.23 1.04 0.95  GLUCOSE 106* 117* 114*   Electrolytes  Recent Labs Lab 01/14/15 0413 01/14/15 0527 01/15/15 0339  CALCIUM 7.8* 7.9* 8.5*  MG 1.9 2.0 2.1  PHOS 4.0 3.5 2.6   Sepsis Markers  Recent Labs Lab 01/13/15 2334 01/14/15 0241  LATICACIDVEN 0.93 0.57   ABG No results for input(s): PHART, PCO2ART, PO2ART in the last 168 hours. Liver Enzymes  Recent Labs Lab 01/14/15 0035 01/14/15 0413  AST 131* 102*  ALT 213* 163*  ALKPHOS 57 46  BILITOT 1.0 0.8  ALBUMIN 4.5 3.7   Cardiac Enzymes No results for input(s): TROPONINI, PROBNP in the last 168 hours. Glucose No results for input(s): GLUCAP in the last 168 hours.  Imaging No results found.   ASSESSMENT / PLAN: 24 yo male with multiple drug OD, currently on narcan drip. He is hemodynamically stable and not in any acute respiratory distress.  His additional tox screening does not require more than supportive care at this time.  PULMONARY A: Acute hypoxemic respiratory failure  2/2 mulit-drug OD primarily IV narcotics. P:    - No indication for intubation at this time  - Continue supplemental O2 PRN to maintain sats >90  - Will continue to attempt and titrate down narcan.  CARDIOVASCULAR A: Post arrest. S/p CPR x .  2/2 hypoxemia vs hypercapnea.  Good return of neurologic function, now asymptomatic. P:   - Monitor and supportive care  - No indication for hypothermic protocol  - Tele monitoring.  RENAL A:  AKI likley 2/2 arrest P:    - Gentle hydration tonight.  - Recheck in AM.  - Replace electrolytes as indicated.  INFECTIOUS A:  Leukocytosis likely stress reaction from arrest.  No suspected  sources at present time.  CXR clear, though aspiration pneumonia vs pneumonitis would be most plausible if he developed symptoms. P:    - Recheck CBC in AM.  GASTROINTESTINAL: A: Transamonitis likely 2/2 post arrest P:  - Recheck LFTs in AM.  Multi-drug OD: Coricidin comes in multiple preparations some with and some without APAP, initial levels of APAP and ASA negative, as was his EtOH levels.  He has no SI/HI  - Recheck APAP WNL  - Check serum osmolar Gap WNL  - Cont narcan gtt  Hold in the ICU due to LOC when off narcan, closer monitoring and high likelihood of needing intubation if looses control of his airway.  The patient is critically ill with multiple organ systems failure and requires high complexity decision making for assessment and support, frequent evaluation and titration of therapies, application of advanced monitoring technologies and extensive interpretation of multiple databases.   Critical Care Time devoted to patient care services described in this note is  35  Minutes. This time reflects time of care of this signee Dr Koren Bound. This critical care time does not reflect procedure time, or teaching time or supervisory time of PA/NP/Med student/Med Resident etc but could involve care discussion time.  Alyson Reedy, M.D. [facility] Pulmonary/Critical Care Medicine. Pager: [phone]. After hours pager: [phone].  01/15/2015, 12:43 PM

## 2015-01-15 NOTE — Evaluation (Signed)
Physical Therapy Evaluation Patient Details Name: Bradley Hanson MRN: [id number] DOB: 11/28/1990 Today's Date: 01/15/2015   History of Present Illness  24 yo male admitted with drug overdose. Post CPR 15 minutes. Sz. Hx of drug abuse, Sz, anxiety, depression.  Clinical Impression  On eval, pt was Mod Ind with mobility-walked ~500 feet. No LOB or difficulty. 1x eval. Will sign off.     Follow Up Recommendations No PT follow up    Equipment Recommendations  None recommended by PT    Recommendations for Other Services       Precautions / Restrictions Precautions Precautions: None Precaution Comments: Seizure Restrictions Weight Bearing Restrictions: No      Mobility  Bed Mobility Overal bed mobility: Independent                Transfers Overall transfer level: Independent                  Ambulation/Gait Ambulation/Gait assistance: Modified independent (Device/Increase time) Ambulation Distance (Feet): 500 Feet Assistive device: None Gait Pattern/deviations: WFL(Within Functional Limits)        Stairs            Wheelchair Mobility    Modified Rankin (Stroke Patients Only)       Balance Overall balance assessment: No apparent balance deficits (not formally assessed)                                           Pertinent Vitals/Pain Pain Assessment: No/denies pain    Home Living Family/patient expects to be discharged to:: Private residence Living Arrangements: Parent   Type of Home: House Home Access: Stairs to enter   Secretary/administrator of Steps: 5 Home Layout: Two level;Bed/bath upstairs Home Equipment: None      Prior Function Level of Independence: Independent               Hand Dominance        Extremity/Trunk Assessment   Upper Extremity Assessment: Overall WFL for tasks assessed           Lower Extremity Assessment: Overall WFL for tasks assessed      Cervical / Trunk Assessment:  Normal  Communication   Communication: No difficulties  Cognition Arousal/Alertness: Awake/alert Behavior During Therapy: WFL for tasks assessed/performed Overall Cognitive Status: Within Functional Limits for tasks assessed                      General Comments      Exercises        Assessment/Plan    PT Assessment Patent does not need any further PT services  PT Diagnosis     PT Problem List    PT Treatment Interventions     PT Goals (Current goals can be found in the Care Plan section) Acute Rehab PT Goals Patient Stated Goal: none stated PT Goal Formulation: All assessment and education complete, DC therapy    Frequency     Barriers to discharge        Co-evaluation               End of Session Equipment Utilized During Treatment: Gait belt Activity Tolerance: Patient tolerated treatment well Patient left: in bed;with call bell/phone within reach           Time: 1451-1502 PT Time Calculation (min) (ACUTE ONLY): 11 min   Charges:  PT Evaluation $Initial PT Evaluation Tier I: 1 Procedure     PT G Codes:        Weston Anna, MPT Pager: [phone]

## 2015-01-16 DIAGNOSIS — T401X2A Poisoning by heroin, intentional self-harm, initial encounter: Secondary | ICD-10-CM | POA: Insufficient documentation

## 2015-01-16 LAB — HEPATIC FUNCTION PANEL
ALBUMIN: 3.8 g/dL (ref 3.5–5.0)
ALT: 88 U/L — ABNORMAL HIGH (ref 17–63)
AST: 18 U/L (ref 15–41)
Alkaline Phosphatase: 46 U/L (ref 38–126)
BILIRUBIN INDIRECT: 0.9 mg/dL (ref 0.3–0.9)
BILIRUBIN TOTAL: 1.1 mg/dL (ref 0.3–1.2)
Bilirubin, Direct: 0.2 mg/dL (ref 0.1–0.5)
TOTAL PROTEIN: 6.8 g/dL (ref 6.5–8.1)

## 2015-01-16 LAB — CBC
HCT: 41.5 % (ref 39.0–52.0)
Hemoglobin: 13.2 g/dL (ref 13.0–17.0)
MCH: 29.6 pg (ref 26.0–34.0)
MCHC: 31.8 g/dL (ref 30.0–36.0)
MCV: 93 fL (ref 78.0–100.0)
PLATELETS: 242 10*3/uL (ref 150–400)
RBC: 4.46 MIL/uL (ref 4.22–5.81)
RDW: 13.3 % (ref 11.5–15.5)
WBC: 7.9 10*3/uL (ref 4.0–10.5)

## 2015-01-16 LAB — BASIC METABOLIC PANEL
Anion gap: 7 (ref 5–15)
BUN: 7 mg/dL (ref 6–20)
CALCIUM: 9.1 mg/dL (ref 8.9–10.3)
CHLORIDE: 108 mmol/L (ref 101–111)
CO2: 26 mmol/L (ref 22–32)
CREATININE: 0.89 mg/dL (ref 0.61–1.24)
GFR calc Af Amer: >60 mL/min (ref >60)
GFR calc non Af Amer: >60 mL/min (ref >60)
Glucose, Bld: 99 mg/dL (ref 65–99)
Potassium: 4 mmol/L (ref 3.5–5.1)
SODIUM: 141 mmol/L (ref 135–145)

## 2015-01-16 LAB — MAGNESIUM: Magnesium: 2.1 mg/dL (ref 1.7–2.4)

## 2015-01-16 LAB — PHOSPHORUS: Phosphorus: 3.2 mg/dL (ref 2.5–4.6)

## 2015-01-16 NOTE — Discharge Summary (Signed)
Physician Discharge Summary       Patient ID: Bradley Hanson MRN: [id number] DOB/AGE: 02-Apr-1991 24 y.o.  Admit date: 01/13/2015 Discharge date: 01/16/2015  Discharge Diagnoses:   Acute hypoxemic respiratory failure Cardiac arrest  AKI Leukocytosis  Transamonitis Multi-drug Overdose  HTN Detailed Hospital Course:  24 yo male with h/o HTN and narcotic abuse (heroin and oral narcotics). On 10/6 at approximately 730pm he shot heroin and took coricidin (Chlorpheniramine/dextromethorphan/APAP). He is unclear on the quantity he took. He stated that he was not trying to hurt himself and has not had SI/HI. He states that the next thing he recalls is being in the ambulance. Per EMS reports, the patient seized and pulses were reportedly lost, CPR he was given narcan with improvement. However upon arrival to the ED at [facility] he was still in respiratory distress, with marked encephalopathy and acute hypoxemia. He was placed on a narcan gtt with improvement of his mental status and his hypoxemia. He was admitted to the ICU, supported on Narcan gtt, IVFs and tele. He was awake, alert, oriented and in no distress following cessation of narcan on 10/9. He was watched overnight and deemed ready for d/c as on 10/10    Discharge Plan by active problems  All acute issues have resolved. No further resp failure, renal fxn normalized, LFTs trending down. No specific instructions other than avoid future drug abuse. 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 He will be discharged to home today.  HTN Resume lisinopril   Significant Hospital tests/ studies  Consults   Discharge Exam: BP 126/90 mmHg  Pulse 94  Temp(Src) 99.2 F (37.3 C) (Oral)  Resp 19  Ht  (1.905 m)  Wt 114.5 kg (252 lb 6.8 oz)  BMI 31.55 kg/m2  SpO2 98%  General: NAD, awake, NAD Neuro: AAOx3, no focal deficits, CN II-XII, no asterixis  HEENT: NCAT, MM dry,  Cardiovascular:  RRR, S1/s2, no m/r/g Lungs: CTA b/l no m/r/r Abdomen: Normal bowel sounds, soft, non-tender non distended Ext: intact Skin: intact.  Labs at discharge Lab Results  Component Value Date   CREATININE 0.89 01/16/2015   BUN 7 01/16/2015   NA 141 01/16/2015   K 4.0 01/16/2015   CL 108 01/16/2015   CO2 26 01/16/2015   Lab Results  Component Value Date   WBC 7.9 01/16/2015   HGB 13.2 01/16/2015   HCT 41.5 01/16/2015   MCV 93.0 01/16/2015   PLT 242 01/16/2015   Lab Results  Component Value Date   ALT 88* 01/16/2015   AST 18 01/16/2015   ALKPHOS 46 01/16/2015   BILITOT 1.1 01/16/2015   Lab Results  Component Value Date   INR 1.16 01/14/2015    Current radiology studies No results found.  Disposition:  01-Home or Self Care      Discharge Instructions    Diet - low sodium heart healthy    Complete by:  As directed      Increase activity slowly    Complete by:  As directed      Other Restrictions    Complete by:  As directed   May return to work on 10/11.            Medication List    STOP taking these medications        oxyCODONE 5 MG immediate release tablet  Commonly known as:  Oxy IR/ROXICODONE      TAKE these medications  lisinopril 10 MG tablet  Commonly known as:  PRINIVIL,ZESTRIL  Take 10 mg by mouth daily.     meloxicam 7.5 MG tablet  Commonly known as:  MOBIC  Take 1 tablet (7.5 mg total) by mouth daily.       Follow-up Information    Schedule an appointment as soon as possible for a visit with Johny Blamer, MD.   Specialty:  Family Medicine   Why:  As needed   Contact information:   [address] [place] [address] [phone]       Discharged Condition: good  Physician Statement:   The Patient was personally examined, the discharge assessment and plan has been personally reviewed and I agree with ACNP Babcock's assessment and plan. > 30 minutes of time have been dedicated to discharge assessment,  planning and discharge instructions.   SignedAnders Simmonds 01/16/2015, 2:05 PM   Levy Pupa, MD, PhD 01/16/2015, 4:45 PM [facility] [phone] or if no answer [phone]

## 2015-05-26 ENCOUNTER — Emergency Department (HOSPITAL_BASED_OUTPATIENT_CLINIC_OR_DEPARTMENT_OTHER)
Admission: EM | Admit: 2015-05-26 | Discharge: 2015-05-26 | Disposition: A | Discharge status: Home / Self Care | Payer: Self-pay | Attending: Emergency Medicine | Admitting: Emergency Medicine

## 2015-05-26 ENCOUNTER — Encounter (HOSPITAL_BASED_OUTPATIENT_CLINIC_OR_DEPARTMENT_OTHER): Payer: Self-pay | Admitting: *Deleted

## 2015-05-26 DIAGNOSIS — Z79899 Other long term (current) drug therapy: Secondary | ICD-10-CM | POA: Insufficient documentation

## 2015-05-26 DIAGNOSIS — F419 Anxiety disorder, unspecified: Secondary | ICD-10-CM | POA: Insufficient documentation

## 2015-05-26 DIAGNOSIS — F17210 Nicotine dependence, cigarettes, uncomplicated: Secondary | ICD-10-CM | POA: Insufficient documentation

## 2015-05-26 DIAGNOSIS — Z872 Personal history of diseases of the skin and subcutaneous tissue: Secondary | ICD-10-CM | POA: Insufficient documentation

## 2015-05-26 DIAGNOSIS — K0889 Other specified disorders of teeth and supporting structures: Secondary | ICD-10-CM | POA: Insufficient documentation

## 2015-05-26 DIAGNOSIS — F329 Major depressive disorder, single episode, unspecified: Secondary | ICD-10-CM | POA: Insufficient documentation

## 2015-05-26 DIAGNOSIS — Z8679 Personal history of other diseases of the circulatory system: Secondary | ICD-10-CM | POA: Insufficient documentation

## 2015-05-26 MED ORDER — IBUPROFEN 600 MG PO TABS: 600.0000 mg | ORAL_TABLET | Freq: Three times a day (TID) | ORAL | Status: DC | PRN

## 2015-05-26 MED ORDER — PENICILLIN V POTASSIUM 250 MG PO TABS
500.0000 mg | ORAL_TABLET | Freq: Once | ORAL | Status: AC
  Administered 2015-05-26: 500 mg via ORAL
  Filled 2015-05-26: qty 2

## 2015-05-26 MED ORDER — PENICILLIN V POTASSIUM 500 MG PO TABS: 500.0000 mg | ORAL_TABLET | Freq: Three times a day (TID) | ORAL | Status: DC

## 2015-05-26 MED ORDER — BUPIVACAINE HCL 0.5 % IJ SOLN: 50.0000 mL | Freq: Once | INTRAMUSCULAR | Status: DC

## 2015-05-26 MED ORDER — BUPIVACAINE-EPINEPHRINE (PF) 0.5% -1:200000 IJ SOLN
INTRAMUSCULAR | Status: AC
  Filled 2015-05-26: qty 1.8

## 2015-05-26 MED ORDER — IBUPROFEN 800 MG PO TABS
800.0000 mg | ORAL_TABLET | Freq: Once | ORAL | Status: AC
  Administered 2015-05-26: 800 mg via ORAL
  Filled 2015-05-26: qty 1

## 2015-05-26 MED ORDER — BUPIVACAINE-EPINEPHRINE (PF) 0.5% -1:200000 IJ SOLN
1.8000 mL | Freq: Once | INTRAMUSCULAR | Status: AC
  Administered 2015-05-26: 1.8 mL

## 2015-05-26 NOTE — ED Provider Notes (Signed)
CSN: [id number]     Arrival date & time 05/26/15  1625 History   First MD Initiated Contact with Patient 05/26/15 1648     Chief Complaint  Patient presents with  . Dental Pain     Patient is a 25 y.o. male presenting with tooth pain.  Dental Pain  patient presents emergency department several days of right upper dental pain.  He has a long-standing history of dental decay.  He also has a history of opioid abuse.  He presents with moderate to severe pain.  No difficulty breathing or swallowing.  He reports it is painful to chew.  He has not seen a dentist.  Past Medical History  Diagnosis Date  . Migraine   . Anxiety   . Pilonidal cyst   . Depression   . Pilonidal cyst with abscess 03/20/2011  . Seizures (HCC)   . Overdose     OD on Tramadol 2013 [facility]   Past Surgical History  Procedure Laterality Date  . Cholecystectomy  2006  . Incise and drain abcess      pilo cyst  . Multiple extractions with alveoloplasty N/A 12/22/2013    Procedure: EXTRACTION OF TOOTH #30 WITH ALVEOLOPLASTY AND GROSS DEBRIDEMENT OF REMAINING TEETH ;  Surgeon: Charlynne Pander, DDS;  Location: [facility] OR;  Service: Oral Surgery;  Laterality: N/A;   Family History  Problem Relation Age of Onset  . Heart disease Paternal Grandmother    Social History  Substance Use Topics  . Smoking status: Current Every Day Smoker -- 0.25 packs/day    Types: Cigarettes  . Smokeless tobacco: None  . Alcohol Use: Yes    Review of Systems  All other systems reviewed and are negative.     Allergies  Review of patient's allergies indicates no known allergies.  Home Medications   Prior to Admission medications   Medication Sig Start Date End Date Taking? Authorizing Provider  sertraline (ZOLOFT) 100 MG tablet Take 100 mg by mouth daily.   Yes Historical Provider, MD   BP 153/111 mmHg  Pulse 95  Temp(Src) 98.3 F (36.8 C) (Oral)  Resp 18  Ht  (1.905 m)  Wt 250 lb (113.399 kg)  BMI 31.25 kg/m2  SpO2  100% Physical Exam  Constitutional: He is oriented to person, place, and time. He appears well-developed and well-nourished.  HENT:  Head: Normocephalic.  Eyes: EOM are normal.  Dental decay and tenderness of tooth #2 without gingival swelling or drainage.   Neck: Normal range of motion.  Pulmonary/Chest: Effort normal.  Abdominal: He exhibits no distension.  Musculoskeletal: Normal range of motion.  Neurological: He is alert and oriented to person, place, and time.  Psychiatric: He has a normal mood and affect.  Nursing note and vitals reviewed.   ED Course  Dental Performed by: Azalia Bilis Authorized by: Azalia Bilis Consent: Verbal consent obtained. Risks and benefits: risks, benefits and alternatives were discussed Consent given by: patient Time out: Immediately prior to procedure a "time out" was called to verify the correct patient, procedure, equipment, support staff and site/side marked as required. Local anesthesia used: yes Anesthesia: nerve block Local anesthetic: bupivacaine 0.5% without epinephrine Anesthetic total: 1.8 ml Patient tolerance: Patient tolerated the procedure well with no immediate complications   (including critical care time)    Labs Review Labs Reviewed - No data to display  Imaging Review No results found. I have personally reviewed and evaluated these images and lab results as part of my medical  decision-making.   EKG Interpretation None      MDM   Final diagnoses:  Pain, dental    Dental Pain. Home with antibiotics and pain medicine. Recommend dental follow up. No signs of gingival abscess. Tolerating secretions. Airway patent. No sub lingular swelling     Azalia Bilis, MD 05/26/15 1714

## 2015-05-26 NOTE — ED Notes (Signed)
Top rear molar dental pain x 1 week.

## 2015-05-26 NOTE — Discharge Instructions (Signed)
°Emergency Department Resource Guide °1) Find a Doctor and Pay Out of Pocket °Although you won't have to find out who is covered by your insurance plan, it is a good idea to ask around and get recommendations. You will then need to call the office and see if the doctor you have chosen will accept you as a new patient and what types of options they offer for patients who are self-pay. Some doctors offer discounts or will set up payment plans for their patients who do not have insurance, but you will need to ask so you aren't surprised when you get to your appointment. ° °2) Contact Your Local Health Department °Not all health departments have doctors that can see patients for sick visits, but many do, so it is worth a call to see if yours does. If you don't know where your local health department is, you can check in your phone book. The CDC also has a tool to help you locate your state's health department, and many state websites also have listings of all of their local health departments. ° °3) Find a Walk-in Clinic °If your illness is not likely to be very severe or complicated, you may want to try a walk in clinic. These are popping up all over the country in pharmacies, drugstores, and shopping centers. They're usually staffed by nurse practitioners or physician assistants that have been trained to treat common illnesses and complaints. They're usually fairly quick and inexpensive. However, if you have serious medical issues or chronic medical problems, these are probably not your best option. ° °No Primary Care Doctor: °- Call [facility] at  832-8000 - they can help you locate a primary care doctor that  accepts your insurance, provides certain services, etc. °- Physician Referral Service- 1-800-533-3463 ° °Chronic Pain Problems: °Organization         Address  Phone   Notes  °[facility]  (336) 297-2271 Patients need to be referred by their primary care doctor.  ° °Medication  Assistance: °Organization         Address  Phone   Notes  °[place] [facility] [address] °[place], [place] [address] (336) 641-8030 --Must be a resident of [place] °-- Must have NO insurance coverage whatsoever (no Medicaid/ Medicare, etc.) °-- The pt. MUST have a primary care doctor that directs their care regularly and follows them in the community °  °[facility]  (866) 331-1348   °[employer]  (888) 892-1162   ° °Agencies that provide inexpensive medical care: °Organization         Address  Phone   Notes  °[facility]  (336) 832-8035   °[facility]    (336) 832-7272   °[facility] [address] °[place], [place] [address] (336) 832-4777   °[facility] [address], °[place] (336) 271-4999   °[facility]    (336) 373-0678   °[facility]    (336) 272-1050   °[facility] ° [address], [place] Phone:  (336) 832-4444, Fax:  (336) 832-4440 Hours of Operation:  9 am - 6 pm, M-F.  Also accepts Medicaid/Medicare and self-pay.  °[facility] ° [address], [place] Phone: (336) 832-3150, Fax: (336) 832-3151. Hours of Operation:  8:30 am - 5:30 pm, M-F.  Also accepts Medicaid and self-pay.  °[facility] [place] [address], [place] Phone: (336) 878-6027   °[facility] [address], [place], [place] (336)723-1848, Ext. 123 Mondays & Thursdays: 7-9 AM.  First 15 patients are seen on a first come, first serve basis. °  ° °Medicaid-accepting [place] Providers: ° °Organization         Address  Phone   Notes  °[facility] [address], [place] (336) 641-2100 Also accepts self-pay patients.  °[facility] [address], [place] ° (336) 856-9996   °[facility] [address], [place]  (336) 288-8857   °[facility] [address] [place] [address], [place] (336) 299-7000   °Veita Bland [address], [place]  ° (336) 373-1557 Only accepts [place] Access Medicaid patients after they have their name applied to their card.  ° °Self-Pay (no insurance) in [place]: ° °Organization         Address  Phone   Notes  °Sickle Cell Patients, [facility] [address], [place] (336) 832-1970   °[facility] [address], [place] (336) 832-4400   °[facility] ° [address], [place] (336) 992-4800   °[facility]/Dr. Osei-Bonsu ° [address] [place] [address], [place] or [address], [place] (336) 841-8500 Phone number for both [place] and [place] locations is the same.  °[facility] [address], [place] (336) 299-0000   °[facility] [address] [place] [address], [place] or [address] (336) 852-7530 °(336) 878-2260   °[facility] [address], [place] (336) 350-1642, phone; (336) 294-5005, fax Sees patients 1st and 3rd Saturday of every month.  Must not qualify for public or private insurance (i.e. Medicaid, Medicare, [place] Health Choice, Veterans' Benefits) • Household income should be no more than 200% of the poverty level •The clinic cannot treat you if you are pregnant or think you are pregnant • Sexually transmitted diseases are not treated at the clinic.  ° ° °Dental Care: °Organization         Address  Phone  Notes  °[place] [facility] [address], [place] (336) 641-6152 Accepts children up to age 21 who are enrolled in Medicaid or [place] Health Choice; pregnant women with a Medicaid card; and children who have applied for Medicaid or [place] Health Choice, but were declined, whose parents can pay a reduced fee at time of service.  °[place]  [facility] [place]  [address], [place] (336) 641-7733 Accepts children up to age 21 who are enrolled in Medicaid or [place] Health Choice; pregnant women with a Medicaid card; and children who have applied for Medicaid or [place] Health Choice, but were declined, whose parents can pay a reduced fee at time of service.  °[facility] ° [address], [place] (336) 641-4533 Patients are seen by appointment only. Walk-ins are not accepted. [facility] will see patients 18 years of age and older. °Monday - Tuesday (8am-5pm) °Most Wednesdays (8:30-5pm) °$30 per visit, cash only  °[facility] ° [address], [place] (336) 641-4533 Patients are seen by appointment only. Walk-ins are not accepted. [facility] will see patients 18 years of age and older. °One   Wednesday Evening (Monthly: Volunteer Based).  $30 per visit, cash only  °[facility]  (919) 537-3737 for adults; Children under age 4, call Graduate Pediatric Dentistry at ([phone]) [phone]. Children aged 4-14, please call (919) [phone] to request a pediatric application. ° Dental services are provided in all areas of dental care including fillings, crowns and bridges, complete and partial dentures, implants, gum treatment, root canals, and extractions. Preventive care is also provided. Treatment is provided to both adults and children. °Patients are selected via a lottery and there is often a waiting list. °  °[facility] [address], °[place] ° (336) 763-8833 [email or web address] °  °[facility] [address], [place], [place] (336)723-1848, Ext. 123 Second and Fourth Thursday of each month, opens at 6:30 AM; Clinic ends at 9 AM.  Patients are seen on a first-come first-served basis, and a limited number are seen during each clinic.  ° °[facility] ° [address], [place], [place] (336) 723-7904    Eligibility Requirements °You must have lived in [place], [place], or [place] counties for at least the last three months. °  You cannot be eligible for state or federal sponsored healthcare insurance, including Veterans Administration, Medicaid, or Medicare. °  You generally cannot be eligible for healthcare insurance through your employer.  °  How to apply: °Eligibility screenings are held every Tuesday and Wednesday afternoon from 1:00 pm until 4:00 pm. You do not need an appointment for the interview!  °[facility] [address], [place], [place] 336-631-2330   °[facility]  336-342-8273   °[facility]  336-703-3100   °[facility]  336-570-6415   ° °Behavioral Health Resources in the Community: °Intensive Outpatient Programs °Organization         Address  Phone  Notes  °[facility] [address], [place], [place] 336-878-6098   °[facility] [address], [place], [place] 336-832-9800   °[facility] [address], [place], [place] ° 336-882-2125   °[facility] [address],  °[place], [place] 1-800-853-5163 or 336-641-4981   °Substance Abuse Resources °Organization         Address  Phone  Notes  °[facility]  336-882-2125   °[facility]  336-784-9470   °[facility]  336-285-9073   °[facility]  336-845-3988   °Residential & Outpatient Substance Abuse Program  1-800-659-3381   °Psychological Services °Organization         Address  Phone  Notes  °[facility]  336- 832-9600   °[facility]  336- 378-7881   °[facility] [address], [place] 1-800-853-5163 or 336-641-4981   ° °Mobile Crisis Teams °Organization         Address  Phone  Notes  °[facility], Mobile Crisis Care Unit  1-877-626-1772   °Assertive °[facility] ° [address]  [place], [place] 336-834-9664   °Sharon DeEsch [address] °[place] 336-554-5454   ° °Self-Help/Support Groups °Organization         Address  Phone             Notes  °[facility] - variety of support groups  336- 373-1402 Call for more information  °Narcotics Anonymous (NA), [facility] [address], °[place]   2 meetings at this location  ° °  Residential Treatment Programs °Organization         Address  Phone  Notes  °[facility] [address],    °[place]  1-866-801-8205   °[facility] ° [address], [place], [place] 704-293-8524   °[facility] [address], [place] 336-845-3988 Admissions: 8am-3pm M-F  °[facility] [address]    °[place], [place] 336-841-1104   °[facility] [address], [place], [place] 336-379-7146   °[facility] [address]  °[place], [place] 336-285-9073   °[facility] - Intensive Outpatient [address], , [place] 336-852-3033   °[facility] [address]  °[place], [place] 1-877-615-2722 or 336-784-9470   °[facility] [address] [place], [place] 336-227-7417 Accepts Medicaid  °[facility] [address]  ° [place] 1-800-659-3381 Substance Abuse/Addiction Treatment  ° °[place] Behavioral Health Resources °Organization         Address  Phone  Notes  °[facility]  (888) 581-9988   °Julie Brannon, PhD [address] [place], [place]   (336) 349-5553 or (336) 951-0000   °[facility]   [address] °[place], [place] (336) 349-4454   °[facility] [address], [place], [place] (336) 342-8316 Insurance/Medicaid/sponsorship through [facility]  °[facility] [address]                                    [place], [place] (336) 342-8316 Therapy/tele-psych/case    °[facility] [address]  ° [place], [place] (336) 349-2233    °Dr. Arfeen  (336) 349-4544   °[facility] [place]  [employer] [place] [facility] 1) [address], [place] °2) [address], [place] °3)  [address], [place] (336) 349-3220 °(336) 342-7768 ° °(336) 342-8140   °[place] [facility] (336) 342-1394 or (336) 342-3537 (After Hours)    ° ° °

## 2015-05-26 NOTE — ED Notes (Signed)
[facility] called for pick up 

## 2015-09-09 ENCOUNTER — Encounter (HOSPITAL_BASED_OUTPATIENT_CLINIC_OR_DEPARTMENT_OTHER): Payer: Self-pay | Admitting: *Deleted

## 2015-09-09 ENCOUNTER — Emergency Department (HOSPITAL_BASED_OUTPATIENT_CLINIC_OR_DEPARTMENT_OTHER)
Admission: EM | Admit: 2015-09-09 | Discharge: 2015-09-09 | Disposition: A | Discharge status: Home / Self Care | Payer: Self-pay | Attending: Emergency Medicine | Admitting: Emergency Medicine

## 2015-09-09 DIAGNOSIS — F1120 Opioid dependence, uncomplicated: Secondary | ICD-10-CM | POA: Insufficient documentation

## 2015-09-09 DIAGNOSIS — F17210 Nicotine dependence, cigarettes, uncomplicated: Secondary | ICD-10-CM | POA: Insufficient documentation

## 2015-09-09 DIAGNOSIS — F329 Major depressive disorder, single episode, unspecified: Secondary | ICD-10-CM | POA: Insufficient documentation

## 2015-09-09 DIAGNOSIS — F1920 Other psychoactive substance dependence, uncomplicated: Secondary | ICD-10-CM

## 2015-09-09 HISTORY — DX: Other psychoactive substance abuse, uncomplicated: F19.10

## 2015-09-09 LAB — CBC WITH DIFFERENTIAL/PLATELET
Basophils Absolute: 0.1 K/uL (ref 0.0–0.1)
Basophils Relative: 1 %
Eosinophils Absolute: 0 K/uL (ref 0.0–0.7)
Eosinophils Relative: 0 %
HCT: 48.5 % (ref 39.0–52.0)
Hemoglobin: 16.1 g/dL (ref 13.0–17.0)
Lymphocytes Relative: 15 %
Lymphs Abs: 2.3 K/uL (ref 0.7–4.0)
MCH: 30.6 pg (ref 26.0–34.0)
MCHC: 33.2 g/dL (ref 30.0–36.0)
MCV: 92.2 fL (ref 78.0–100.0)
Monocytes Absolute: 1.2 K/uL — ABNORMAL HIGH (ref 0.1–1.0)
Monocytes Relative: 8 %
Neutro Abs: 11.9 K/uL — ABNORMAL HIGH (ref 1.7–7.7)
Neutrophils Relative %: 77 %
Platelets: 432 K/uL — ABNORMAL HIGH (ref 150–400)
RBC: 5.26 MIL/uL (ref 4.22–5.81)
RDW: 13.7 % (ref 11.5–15.5)
WBC: 15.5 K/uL — ABNORMAL HIGH (ref 4.0–10.5)

## 2015-09-09 LAB — SALICYLATE LEVEL: Salicylate Lvl: <4 mg/dL (ref 2.8–30.0)

## 2015-09-09 LAB — URINALYSIS, ROUTINE W REFLEX MICROSCOPIC
Bilirubin Urine: NEGATIVE
Glucose, UA: NEGATIVE mg/dL
Hgb urine dipstick: NEGATIVE
Ketones, ur: NEGATIVE mg/dL
Leukocytes, UA: NEGATIVE
Nitrite: NEGATIVE
Protein, ur: NEGATIVE mg/dL
Specific Gravity, Urine: 1.01 (ref 1.005–1.030)
pH: 6 (ref 5.0–8.0)

## 2015-09-09 LAB — COMPREHENSIVE METABOLIC PANEL WITH GFR
ALT: 22 U/L (ref 17–63)
AST: 17 U/L (ref 15–41)
Albumin: 5.1 g/dL — ABNORMAL HIGH (ref 3.5–5.0)
Alkaline Phosphatase: 62 U/L (ref 38–126)
Anion gap: 7 (ref 5–15)
BUN: 13 mg/dL (ref 6–20)
CO2: 28 mmol/L (ref 22–32)
Calcium: 10 mg/dL (ref 8.9–10.3)
Chloride: 100 mmol/L — ABNORMAL LOW (ref 101–111)
Creatinine, Ser: 1.25 mg/dL — ABNORMAL HIGH (ref 0.61–1.24)
GFR calc Af Amer: >60 mL/min
GFR calc non Af Amer: >60 mL/min
Glucose, Bld: 100 mg/dL — ABNORMAL HIGH (ref 65–99)
Potassium: 4.9 mmol/L (ref 3.5–5.1)
Sodium: 135 mmol/L (ref 135–145)
Total Bilirubin: 1 mg/dL (ref 0.3–1.2)
Total Protein: 8.1 g/dL (ref 6.5–8.1)

## 2015-09-09 LAB — RAPID URINE DRUG SCREEN, HOSP PERFORMED
Amphetamines: NOT DETECTED
BARBITURATES: NOT DETECTED
Benzodiazepines: NOT DETECTED
Cocaine: NOT DETECTED
Opiates: NOT DETECTED
Tetrahydrocannabinol: POSITIVE — AB

## 2015-09-09 LAB — TROPONIN I

## 2015-09-09 LAB — ACETAMINOPHEN LEVEL: Acetaminophen (Tylenol), Serum: <10 ug/mL — ABNORMAL LOW (ref 10–30)

## 2015-09-09 LAB — ETHANOL: Alcohol, Ethyl (B): <5 mg/dL

## 2015-09-09 MED ORDER — SODIUM CHLORIDE 0.9 % IV SOLN
INTRAVENOUS | Status: DC
  Administered 2015-09-09: 11:00:00 via INTRAVENOUS

## 2015-09-09 MED ORDER — SODIUM CHLORIDE 0.9 % IV BOLUS (SEPSIS)
1000.0000 mL | Freq: Once | INTRAVENOUS | Status: AC
  Administered 2015-09-09: 1000 mL via INTRAVENOUS

## 2015-09-09 NOTE — ED Notes (Signed)
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  States he was in [facility] days and released in March for the same.  States he has been using Coricidin D, up to 16 tablets at a time also daily since his release [facility] on June 15, 2015.  States he is here today for help and in patient rehab. States he last took Coricidin D 16 tabs at 0300 today.  Also, he felt his blood pressure was high and took one of his mother's Atenolol 50 mg pta.

## 2015-09-09 NOTE — Discharge Instructions (Signed)
Finding Treatment for Addiction WHAT IS ADDICTION? Addiction is a complex disease of the brain. It causes an uncontrollable (compulsive) need for a substance. You can be addicted to alcohol, illegal drugs, or prescription medicines such as painkillers. Addiction can also be a behavior, like gambling or shopping. The need for the drug or activity can become so strong that you think about it all the time. You can also become physically dependent on a substance. Addiction can change the way your brain works. Because of these changes, getting more of whatever you are addicted to becomes the most important thing to you and feels better than other activities or relationships. Addiction can lead to changes in health, behavior, emotions, relationships, and choices that affect you and everyone around you. HOW DO I KNOW IF I NEED TREATMENT FOR ADDICTION? Addiction is a progressive disease. Without treatment, addiction can get worse. Living with addiction puts you at higher risk for injury, poor health, lost employment, loss of money, and even death. You might need treatment for addiction if:  You have tried to stop or cut down, but you cannot.  Your addiction is causing physical health problems.  You find it annoying that your friends and family are concerned about your alcohol or substance use.  You feel guilty about substance abuse or a compulsive behavior.  You have lied or tried to hide your addiction.  You need a particular substance or activity to start your day or to calm down.  You are getting in trouble at school, work, home, or with the police.  You have done something illegal to support your addiction.  You are running out of money because of your addiction.  You have no time for anything other than your addiction. WHAT TYPES OF TREATMENT ARE AVAILABLE? The treatment program that is right for you will depend on many factors, including the type of addiction you have. Treatment programs  can be outpatient or inpatient. In an outpatient program, you live at home and go to work or school, but you also go to a clinic for treatment. With an inpatient program, you live and sleep at the program facility during treatment. After treatment, you might need a plan for support during recovery. Other treatment options include:   Medicine.  Some addictions may be treated with prescription medicines.  You might also need medicine to treat anxiety or depression.  Counseling and behavior therapy. Therapy can help individuals and families behave in healthier ways and relate more effectively.  Support groups. Confidential group therapy, such as a 12-step program, can help individuals and families during treatment and recovery. No single type of program is right for everyone. Many treatment programs involve a combination of education, counseling, and a 12-step, spiritually-based approach. Some treatment programs are government sponsored. They are geared for patients who do not have private insurance. Treatment programs can vary in many respects, such as:  Cost and types of insurance that are accepted.  Types of on-site medical services that are offered.  Length of stay, setting, and size.  Overall philosophy of treatment. WHAT SHOULD I CONSIDER WHEN SELECTING A TREATMENT PROGRAM? It is important to think about your individual requirements when selecting a treatment program. There are a number of things to consider, such as:  If the program is certified by the appropriate government agency. Even private programs must be certified and employ certified professionals.  If the program is covered by your insurance. If finances are a concern, the first call you should make  is to [employer]your insurance company, if you have health insurance. Ask for a list of treatment programs that are in your network, and confirm any copayments and deductibles that you may have to pay.  If you do not have insurance, or if  you choose to attend a program that does not accept your insurance, discuss whether a payment plan can be set up.  If treatment is available in languages other than English, if needed.  If the program offers detoxification treatment, if needed.  If 12-step meetings are held at the center or if transport is available for patients to attend meetings at other locations.  If the program is professional, organized, and clean.  If the program meets all of your needs, including physical and cultural needs.  If the facility offers specific treatment for your particular addiction.  If support continues to be offered after you have left the program.  If your treatment plan is continually looked at to make sure you are receiving the right treatment at the right time.  If mental health counseling is part of your treatment.  If medicine is included in treatment, if needed.  If your family is included in your treatment plan and if support is offered to them throughout the treatment process.  How the treatment works to prevent relapse. WHERE ELSE CAN I GET HELP?  Your health care provider. Ask him or her to help you find addiction treatment. These discussions are confidential.  The [employer] Council on Alcoholism and Drug Dependence (NCADD). This group has information about treatment centers and programs for people who have an addiction and for family members.  The telephone number is 1-800-NCA-CALL ((667) 417-11401-862 261 4636).  The website is https://ncadd.org/about-ncadd/our-affiliates  The Substance Abuse and Mental Health Services Administration [place] [employer](SAMHSA). This group will help you find publicly funded treatment centers, help hotlines, and counseling services near you.  The telephone number is 1-800-662-HELP (979 051 17131-380-113-1028).  The website is www.findtreatment.RockToxic.plsamhsa.gov In countries outside of the [place], look in M.D.C. Holdingslocal directories for contact information for services in your area.   This  information is not intended to replace advice given to you by your health care provider. Make sure you discuss any questions you have with your health care provider.   Document Released: 02/21/2005 Document Revised: 12/14/2014 Document Reviewed: 01/11/2014 Elsevier Interactive Patient Education 2016 [employer] CorporationCommunity Resource Guide Outpatient Counseling/Substance Abuse Adult The United Ways 211 is a great source of information about community services available.  Access by dialing 2-1-1 from anywhere in [place], or by website -  PooledIncome.plwww.nc211.org.   Other Local Resources (Updated 04/2015)  Crisis Hotlines   Services     Area Served  [facility] Crisis Hotline, available 24 hours a day, 7 days a week: 680-757-1129(802)119-7957 [facility], [place]   [facility] Crisis Hotline, available 24 hours a day, 7 days a week: (854)152-6138210-446-0954 [facility] [place], [place]  [facility] Suicide Prevention Hotline, available 24 hours a day, 7 days a week: 310-791-7159 [facility], [place]  [employer]  Crisis Hotline, available 24 hours a day, 7 days a week: (938)027-2990715-533-8830 [facility], [place]   [facility] Access to [employer]Care Line Crisis Hotline, available 24 hours a day, 7 days a week: [phone] All   [facility] Crisis Hotline, available 24 hours a day, 7 days a week: (502)743-7523(707)519-4327 All   Other Local Resources (Updated 04/2015)  Outpatient Counseling/ Substance Abuse Programs  Services     Address and Phone Number  ADS (Alcohol and Drug Services)  Options include Individual counseling, group counseling, intensive outpatient program (several hours a day, several days a week) Offers depression assessments Provides methadone maintenance program [phone] [address], [address] [place], [place] [address]   [facility]  Offers partial hospitalization/day treatment and DUI/DWI programs [employer], private insurance [phone] [address], [address] [place], [place] [address]  [facility]   Services include intensive outpatient program (several hours a day, several days a week), outpatient treatment, DUI/DWI services, family education Also has some services specifically for [employer] transitional housing  [phone] [address] [place], [place] [address]     [facility] [employer], private pay, and private insurance [phone] [address] [place], [place] [address]  [employer] of Care Services include individual counseling, substance abuse intensive outpatient program (several hours a day, several days a week), day treatment Delene Loll, Medicaid, private insurance [phone] [address], [address], [place] [address]  [facility]  Offers substance abuse intensive outpatient program (several hours a day, several days a week), partial hospitalization program [phone] [address] [place], [place] [address]  [phone] [address] [place], [place] [address]  [phone] [address] [place], [place] [address]  [phone] [place], [place] [address]  [facility] Individual counseling only Accepts private insurance only [phone] [address], [address] [place], [place] [address]  [facility] Methadone maintenance program [phone] [address] [place], [place] [address]  [facility] providing substance abuse and mental health counseling Accepts Medicaid, Medicare, private insurance Offers sliding scale for uninsured [phone] [address] [place], [place]   [facility] [employer]. Offers individual counseling, and intensive in-home services [phone] [address] [place], [place] [address]  [facility]	[place] individual counseling,  family counseling, group therapy, domestic violence counseling, consumer credit counseling Accepts Medicare, Medicaid, private insurance Offers sliding scale for uninsured [phone] [address] [place], [place] [address]  [phone] [facility], [address] [place], [place] [address]  [facility] Offers individual, family and group counseling 3 locations - [place], [place], and [place]  [phone]  [address] [place], [place] [address]  [address] [place], [place] [address]  [address] [place], [place] [address]  [facility]   Offers psychiatric assessment, 8-week Intensive Outpatient Program (several hours a day, several times a week, daytime or evenings), early recovery group, family Program, medication management Private pay or private insurance only [phone], or  [phone] [address] [place], [place] [address]  [employer] individual, couples and family counseling Accepts Medicaid, private insurance, and sliding scale for uninsured [phone] [address] [place], [place] [address]  Len Blalock, MD Individual counseling Private insurance [phone] [address] [place], [place] [address]  [facility]  Offers assessment, substance abuse treatment, and behavioral health treatment [phone] [address] [place], [place] [address]  [facility] Individual counseling Accepts private insurance [phone] [address] [place], [place] [address]  [facility] Individual counseling Delene Loll, private insurance [phone] [address] [place], [place] [address]  [facility]   Offers intensive outpatient program (several hours a day, several times a week) Private pay, private insurance [phone] [facility] [place], [place]  [facility] Individual counseling Medicare,  private insurance [phone] [address] [place], [place] [address]  [facility]   Offers intensive outpatient program (several hours a day, several times a week) and partial hospitalization program [phone] [address] [place], [place] [address]  Emerson Monte, MD Individual counseling [phone] [address] [place], [place] [address]  [facility] Offers Christian counseling to individuals, couples, and families Accepts Medicare and private insurance; offers sliding scale for uninsured [phone] [address] [place], [place] [address]  [facility] counseling [phone] [address] [place], [place] [address]  [facility] [employer] [facility] crisis counseling, individual counseling, group therapy, in-home therapy, domestic violence services, day treatment, DWI services, Administrator, arts (CST), Doctor, hospital (ACTT), substance abuse Intensive Outpatient Program (several hours a day, several times a week) 2 locations - [place] and [place] [phone] [address] [place], [place] [address]  [phone] [address] [place], [place] [address]  [facility]    Individual counseling and group therapy [employer], [place], [place] [phone] [address] [place], [place]  [facility] Offers individual and family counseling Offers LGBTQ services Accepts private insurance and private pay [phone] [address] [place], [place] [address]  [facility]   Offers individual counseling, group therapy, and outpatient detox Accepts private insurance [phone] [address] [place], [place]  [facility] Individual counseling Accepts Medicare, private insurance [phone] [address] [place], [place]  [address]  [employer] Individual counseling Accepts Medicare, private insurance [phone] [address] [place], [place] [address]  [facility]  Offers substance abuse Intensive Outpatient Program (several hours a day, several times a week) [phone], or [phone] [place], [place]

## 2015-09-09 NOTE — ED Provider Notes (Signed)
CSN: [id number]     Arrival date & time 09/09/15  0754 History   First MD Initiated Contact with Patient 09/09/15 0825      Chief Complaint  Patient presents with  . Medical Clearance     (Consider location/radiation/quality/duration/timing/severity/associated sxs/prior Treatment) HPI Patient has had chronic abuse of dextromethorphan. [facility] and [facility] March 9th. Last night at approximately 3 AM, the patient reports having taken 16 Cold and Cough Coricidin tablets (chlorpheniramine 4 mg and dextromethorphan 30 mg per tablet). He reports he drank one large beer. Denies any other coingestions. He does take Zoloft as a regularly prescribed medication. He denies any increased consumption of his Zoloft. This morning, when he felt like his heart was racing and his blood pressure was elevated, he took one of his father's atenolol tablets which per the patient's mother who is present, is a 100 mg atenolol. The patient's mother reports that at baseline she thinks he is borderline hypertensive consistently at the 140s/90s. The patient reports that he has felt general chest tightness and been sweating a lot. Patient's mother reports that he normally sweats profusely when he takes the dextromethorphan. The patient reports that he just doesn't feel good and he wants it out of his system. He reports that he is tired of making the same mistakes over and over again. No abdominal pain, no nausea, no vomiting. Past Medical History  Diagnosis Date  . Migraine   . Anxiety   . Pilonidal cyst   . Depression   . Pilonidal cyst with abscess 03/20/2011  . Seizures (HCC)   . Overdose     OD on Tramadol 2013 [facility]  . Depression   . Substance abuse     opoids and triple c"s   Past Surgical History  Procedure Laterality Date  . Cholecystectomy  2006  . Incise and drain abcess      pilo cyst  . Multiple extractions with alveoloplasty N/A 12/22/2013    Procedure: EXTRACTION OF TOOTH #30  WITH ALVEOLOPLASTY AND GROSS DEBRIDEMENT OF REMAINING TEETH ;  Surgeon: Charlynne Panderonald F Kulinski, DDS;  Location: [facility] OR;  Service: Oral Surgery;  Laterality: N/A;   Family History  Problem Relation Age of Onset  . Heart disease Paternal Grandmother    Social History  Substance Use Topics  . Smoking status: Current Every Day Smoker -- 0.25 packs/day    Types: Cigarettes  . Smokeless tobacco: None  . Alcohol Use: Yes    Review of Systems 10 Systems reviewed and are negative for acute change except as noted in the HPI.    Allergies  Review of patient's allergies indicates no known allergies.  Home Medications   Prior to Admission medications   Medication Sig Start Date End Date Taking? Authorizing Provider  ibuprofen (ADVIL,MOTRIN) 600 MG tablet Take 1 tablet (600 mg total) by mouth every 8 (eight) hours as needed. 05/26/15   Azalia BilisKevin Campos, MD  penicillin v potassium (VEETID) 500 MG tablet Take 1 tablet (500 mg total) by mouth 3 (three) times daily. 05/26/15   Azalia BilisKevin Campos, MD  sertraline (ZOLOFT) 100 MG tablet Take 100 mg by mouth daily.    Historical Provider, MD   BP 132/96 mmHg  Pulse 85  Temp(Src) 99 F (37.2 C) (Oral)  Resp 20  Ht 6\' 1"  (1.854 m)  Wt 240 lb (108.863 kg)  BMI 31.67 kg/m2  SpO2 100% Physical Exam  Constitutional: He is oriented to person, place, and time.  Patient is mildly obese. He is alert and nontoxic. He is profusely diaphoretic. No respiratory distress. Patient is not overtly agitated.  HENT:  Head: Normocephalic and atraumatic.  Eyes: EOM are normal. Pupils are equal, round, and reactive to light.  Mild diffuse scleral injection bilaterally.  Neck: Neck supple.  Cardiovascular: Normal rate, regular rhythm, normal heart sounds and intact distal pulses.   Distal pulses are 2+ and symmetric. No peripheral edema.  Pulmonary/Chest: Effort normal and breath sounds normal.  Abdominal: Soft. Bowel sounds are normal. He exhibits no distension. There is no  tenderness.  Musculoskeletal: Normal range of motion. He exhibits no edema or tenderness.  Neurological: He is alert and oriented to person, place, and time. He has normal strength. No cranial nerve deficit. He exhibits normal muscle tone. Coordination normal. GCS eye subscore is 4. GCS verbal subscore is 5. GCS motor subscore is 6.  Mild nystagmus in all directions. Patient is mildly anxious but does not have overt tremor with hands extended.  Skin: Skin is warm and intact.  Profusely diaphoretic  Psychiatric:  Patient is appropriately interactive in giving history. At one point he became slightly tearful. Fair eye contact.    ED Course  Procedures (including critical care time) Labs Review Labs Reviewed  COMPREHENSIVE METABOLIC PANEL - Abnormal; Notable for the following:    Chloride 100 (*)    Glucose, Bld 100 (*)    Creatinine, Ser 1.25 (*)    Albumin 5.1 (*)    All other components within normal limits  ACETAMINOPHEN LEVEL - Abnormal; Notable for the following:    Acetaminophen (Tylenol), Serum <10 (*)    All other components within normal limits  CBC WITH DIFFERENTIAL/PLATELET - Abnormal; Notable for the following:    WBC 15.5 (*)    Platelets 432 (*)    Neutro Abs 11.9 (*)    Monocytes Absolute 1.2 (*)    All other components within normal limits  URINE RAPID DRUG SCREEN, HOSP PERFORMED - Abnormal; Notable for the following:    Tetrahydrocannabinol POSITIVE (*)    All other components within normal limits  ETHANOL  SALICYLATE LEVEL  TROPONIN I  URINALYSIS, ROUTINE W REFLEX MICROSCOPIC (NOT AT [facility])    Imaging Review No results found. I have personally reviewed and evaluated these images and lab results as part of my medical decision-making.   EKG Interpretation   Date/Time:  Saturday September 09 2015 09:22:17 EDT Ventricular Rate:  82 PR Interval:  167 QRS Duration: 82 QT Interval:  363 QTC Calculation: 424 R Axis:   74 Text Interpretation:  Sinus rhythm normal.  no change from previous.  Confirmed by Donnald Garre, MD, Lebron Conners [phone]) on 09/09/2015 9:26:52 AM     Consult: Revonda Standard at poison control. Supportive care until symptoms are resolved. Consult: TTS will send resources for the patient for outpatient treatment of dependency. MDM   Final diagnoses:  Dextromethorphan use disorder, severe, dependence (HCC)   Patient is a chronic abuse of dextromethorphan. He consumed  16 tablets yesterday evening. This was a combination product with chlorpheniramine but no acetaminophen. Patient complained of agitation and not feeling well. He has been treated symptomatically with fluid hydration. Recheck at 11:45 and the patient feels much better and vital signs are normalized. At this time he is aware of outpatient resources for dependency treatment. He denies any suicidal or homicidal thoughts. He reports he does want to get help but does not have any intent or desire to hurt himself. Patient is here with  his mother who is supportive. Resources are provided.    Arby Barrette, MD 09/09/15 1150

## 2015-09-09 NOTE — [facility] Counselor (Signed)
Writer spoke w/ EDP Pfeiffer. Writer [facility] EDP [facility] for opioid abuse or triple C's abuse. Writer then spoke w/ pt's RN Aggie Cosierheresa and Clinical research associatewriter faxed pt two page list of residential substance abuse programs in the area. [facility] so he has been through the admission screening before and understands the process and requirements for admission.  Evette Cristalaroline Paige Lakie Mclouth, [place] Therapeutic Triage Specialist

## 2016-02-29 ENCOUNTER — Encounter (HOSPITAL_BASED_OUTPATIENT_CLINIC_OR_DEPARTMENT_OTHER): Payer: Self-pay | Admitting: Adult Health

## 2016-02-29 ENCOUNTER — Emergency Department (HOSPITAL_BASED_OUTPATIENT_CLINIC_OR_DEPARTMENT_OTHER)
Admission: EM | Admit: 2016-02-29 | Discharge: 2016-03-01 | Disposition: A | Discharge status: Home / Self Care | Payer: Self-pay | Attending: Emergency Medicine | Admitting: Emergency Medicine

## 2016-02-29 DIAGNOSIS — F17210 Nicotine dependence, cigarettes, uncomplicated: Secondary | ICD-10-CM | POA: Insufficient documentation

## 2016-02-29 DIAGNOSIS — K047 Periapical abscess without sinus: Secondary | ICD-10-CM | POA: Insufficient documentation

## 2016-02-29 LAB — CBC WITH DIFFERENTIAL/PLATELET
BASOS ABS: 0 10*3/uL (ref 0.0–0.1)
BASOS PCT: 0 %
EOS ABS: 0 10*3/uL (ref 0.0–0.7)
EOS PCT: 0 %
HCT: 43.2 % (ref 39.0–52.0)
Hemoglobin: 14.5 g/dL (ref 13.0–17.0)
LYMPHS PCT: 5 %
Lymphs Abs: 0.7 10*3/uL (ref 0.7–4.0)
MCH: 30.3 pg (ref 26.0–34.0)
MCHC: 33.6 g/dL (ref 30.0–36.0)
MCV: 90.2 fL (ref 78.0–100.0)
Monocytes Absolute: 1 10*3/uL (ref 0.1–1.0)
Monocytes Relative: 8 %
Neutro Abs: 11.7 10*3/uL — ABNORMAL HIGH (ref 1.7–7.7)
Neutrophils Relative %: 87 %
PLATELETS: 223 10*3/uL (ref 150–400)
RBC: 4.79 MIL/uL (ref 4.22–5.81)
RDW: 12.8 % (ref 11.5–15.5)
WBC: 13.4 10*3/uL — AB (ref 4.0–10.5)

## 2016-02-29 LAB — BASIC METABOLIC PANEL
Anion gap: 8 (ref 5–15)
BUN: 13 mg/dL (ref 6–20)
CALCIUM: 9.9 mg/dL (ref 8.9–10.3)
CO2: 29 mmol/L (ref 22–32)
CREATININE: 1.06 mg/dL (ref 0.61–1.24)
Chloride: 101 mmol/L (ref 101–111)
GFR calc Af Amer: >60 mL/min (ref >60)
Glucose, Bld: 140 mg/dL — ABNORMAL HIGH (ref 65–99)
POTASSIUM: 4.3 mmol/L (ref 3.5–5.1)
SODIUM: 138 mmol/L (ref 135–145)

## 2016-02-29 MED ORDER — BUPIVACAINE-EPINEPHRINE (PF) 0.5% -1:200000 IJ SOLN
1.8000 mL | Freq: Once | INTRAMUSCULAR | Status: AC
  Administered 2016-02-29: 1.8 mL
  Filled 2016-02-29: qty 1.8

## 2016-02-29 MED ORDER — ACETAMINOPHEN 500 MG PO TABS
1000.0000 mg | ORAL_TABLET | Freq: Once | ORAL | Status: AC
  Administered 2016-02-29: 1000 mg via ORAL
  Filled 2016-02-29: qty 2

## 2016-02-29 MED ORDER — CLINDAMYCIN PHOSPHATE 600 MG/50ML IV SOLN
600.0000 mg | Freq: Once | INTRAVENOUS | Status: AC
  Administered 2016-02-29: 600 mg via INTRAVENOUS
  Filled 2016-02-29: qty 50

## 2016-02-29 MED ORDER — IBUPROFEN 800 MG PO TABS
800.0000 mg | ORAL_TABLET | Freq: Once | ORAL | Status: AC
  Administered 2016-02-29: 800 mg via ORAL
  Filled 2016-02-29: qty 1

## 2016-02-29 NOTE — ED Triage Notes (Signed)
PResents with left lower jaw swelling, possible dental abscess, [facility], HX of abscessed tooth on same side before and required hospitalization at that time due to sepsis. Pt HR is 138 at this time and temp of 100.0 orall, BP 154/98.  Took Ibuprofen at 12:30 today 400 mg.

## 2016-02-29 NOTE — ED Notes (Signed)
Given po fluids 

## 2016-02-29 NOTE — ED Notes (Signed)
States has had a toothache x 3 weeks and it has gotten better and then last night started hurting again and this am swelling to left jaw area.

## 2016-02-29 NOTE — ED Notes (Signed)
Pt alert, NAD, calm, interactive, resps e/u, speaking in clear complete sentences, VSS, "feel a little better", resting comfortably.

## 2016-02-29 NOTE — ED Provider Notes (Signed)
[facility] Provider Note   CSN: [id number] Arrival date & time: 02/29/16  7829   By signing my name below, I, Bradley Hanson, attest that this documentation has been prepared under the direction and in the presence of Bradley Spates, MD . Electronically Signed: Freida Hanson, Scribe. 02/29/2016. 7:38 PM.   History   Chief Complaint Chief Complaint  Patient presents with  . Oral Swelling    The history is provided by the patient. No language interpreter was used.     HPI Comments:  Bradley Hanson is a 25 y.o. male who presents to the Emergency Department complaining of moderate left sided facial swelling since this AM. He reports associated left sided dental pain in 1 tooth on that side. He has been experiencing the dental pain x ~3 weeks but states the pain seemed to be getting better over the last week up until 24 hours ago. Pt has a h/o hospitalization secondary to a right sided dental abscess. He has had several teeth extracted including his wisdom teeth.  He also reports fever with TMAX of 100.  No alleviating factors noted. No vomiting or recent illness.   Past Medical History:  Diagnosis Date  . Anxiety   . Depression   . Depression   . Migraine   . Overdose    OD on Tramadol 2013 [facility]  . Pilonidal cyst   . Pilonidal cyst with abscess 03/20/2011  . Seizures (HCC)   . Substance abuse    opoids and triple c"s    Patient Active Problem List   Diagnosis Date Noted  . Intentional heroin overdose (HCC)   . Multiple drug overdose 01/14/2015  . Dental caries 12/22/2013  . 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 Pilonidal cyst with abscess 03/20/2011    Past Surgical History:  Procedure Laterality Date  . CHOLECYSTECTOMY  2006  . INCISE AND DRAIN ABCESS     pilo cyst  . MULTIPLE EXTRACTIONS WITH ALVEOLOPLASTY N/A 12/22/2013   Procedure: EXTRACTION OF TOOTH #30 WITH ALVEOLOPLASTY AND GROSS DEBRIDEMENT OF REMAINING TEETH ;  Surgeon: Bradley Hanson, DDS;   Location: [facility] OR;  Service: Oral Surgery;  Laterality: N/A;       Home Medications    Prior to Admission medications   Medication Sig Start Date End Date Taking? Authorizing Provider  clindamycin (CLEOCIN) 150 MG capsule Take 2 capsules (300 mg total) by mouth 4 (four) times daily. 03/01/16   Bradley Spates, MD  ibuprofen (ADVIL,MOTRIN) 800 MG tablet Take 1 tablet (800 mg total) by mouth 3 (three) times daily. 03/01/16   Bradley Spates, MD  penicillin v potassium (VEETID) 500 MG tablet Take 1 tablet (500 mg total) by mouth 3 (three) times daily. 05/26/15   Bradley Bilis, MD  sertraline (ZOLOFT) 100 MG tablet Take 100 mg by mouth daily.    Historical Provider, MD    Family History Family History  Problem Relation Age of Onset  . Heart disease Paternal Grandmother     Social History Social History  Substance Use Topics  . Smoking status: Current Every Day Smoker    Packs/day: 0.25    Types: Cigarettes  . Smokeless tobacco: Not on file  . Alcohol use Yes     Allergies   Patient has no known allergies.   Review of Systems Review of Systems 10 systems reviewed and all are negative for acute change except as noted in the HPI.  Physical Exam Updated Vital Signs BP 120/81 (BP Location: Right  Arm)   Pulse 105   Temp 99.5 F (37.5 C) (Oral)   Resp 16   Wt 255 lb (115.7 kg)   SpO2 93%   BMI 33.64 kg/m   Physical Exam  Constitutional: He is oriented to person, place, and time. He appears well-developed and well-nourished. No distress.  HENT:  Head: Normocephalic and atraumatic.  Mouth/Throat:    Moist mucous membranes Swelling along left jaw line with mucosal swelling below teeth 19-21  Deep dental caries in tooth 19  Eyes: Conjunctivae are normal. Pupils are equal, round, and reactive to light.  Neck: Neck supple. No tracheal deviation present.  Cardiovascular: Regular rhythm and normal heart sounds.  Tachycardia present.   No murmur  heard. Pulmonary/Chest: Effort normal and breath sounds normal.  Abdominal: Soft. Bowel sounds are normal. He exhibits no distension. There is no tenderness.  Musculoskeletal: He exhibits no edema.  Lymphadenopathy:    He has no cervical adenopathy.  Neurological: He is alert and oriented to person, place, and time.  Fluent speech  Skin: Skin is warm and dry. No rash noted.  Psychiatric: He has a normal mood and affect. Judgment normal.  Nursing note and vitals reviewed.    ED Treatments / Results  DIAGNOSTIC STUDIES:  Oxygen Saturation is 99% on RA, normal by my interpretation.    COORDINATION OF CARE:  7:28 PM Discussed treatment plan with pt at bedside and pt agreed to plan.  Labs (all labs ordered are listed, but only abnormal results are displayed) Labs Reviewed  BASIC METABOLIC PANEL - Abnormal; Notable for the following:       Result Value   Glucose, Bld 140 (*)    All other components within normal limits  CBC WITH DIFFERENTIAL/PLATELET - Abnormal; Notable for the following:    WBC 13.4 (*)    Neutro Abs 11.7 (*)    All other components within normal limits  CULTURE, BLOOD (ROUTINE X 2)  CULTURE, BLOOD (ROUTINE X 2)    EKG  EKG Interpretation None       Radiology No results found.  Procedures .Marland Kitchen.Incision and Drainage Date/Time: 03/01/2016 12:35 AM Performed by: Bradley SpatesLITTLE, Bradley Hanson Authorized by: Bradley SpatesLITTLE, Bradley Hanson Hanson   Consent:    Consent obtained:  Verbal   Consent given by:  Patient Location:    Type:  Abscess   Location:  Mouth   Mouth location: periapical. Anesthesia (see MAR for exact dosages):    Anesthesia method:  Local infiltration   Local anesthetic:  Lidocaine 1% WITH epi Procedure type:    Complexity:  Simple Procedure details:    Incision types:  Single straight   Incision depth:  Submucosal   Scalpel blade:  11   Wound management:  Irrigated with saline and probed and deloculated   Drainage:  Bloody   Drainage amount:   Scant   Wound treatment:  Wound left open   Packing materials:  None Post-procedure details:    Patient tolerance of procedure:  Tolerated well, no immediate complications   (including critical care time)  Medications Ordered in ED Medications  ibuprofen (ADVIL,MOTRIN) tablet 800 mg (800 mg Oral Given 02/29/16 1922)  bupivacaine-epinephrine (MARCAINE W/ EPI) 0.5% -1:200000 injection 1.8 mL (1.8 mLs Infiltration Given 02/29/16 2104)  clindamycin (CLEOCIN) IVPB 600 mg (0 mg Intravenous Stopped 02/29/16 2334)  acetaminophen (TYLENOL) tablet 1,000 mg (1,000 mg Oral Given 02/29/16 2104)     Initial Impression / Assessment and Plan / ED Course  I have reviewed the triage vital  signs and the nursing notes.  Pertinent labs  that were available during my care of the patient were reviewed by me and considered in my medical decision making (see chart for details).  Clinical Course     Pt w/ 1 day of L facial swelling In the setting of recent left lower tooth pain. He was awake, alert, well-appearing on exam. He was initially tachycardic in triage and soon afterwards was noted to have a fever. Gave ibuprofen and later Tylenol. He had swelling of his left mandible and multiple dental caries including deep erosion of tooth #19. I suspect that this is the source of his periapical abscess. No trismus or difficulty swallowing on exam, no breathing problems. Labs show normal creatinine, WBC 13.4. His tachycardia and fever are improved after medications, IV fluids, clindamycin. Incised mucosa at tooth #19 to allow for drainage. No signs/sx of Ludwig's angina. He is well appearing on re-examination and I feel he is safe for discharge. Gave prescription for clindamycin and instructions on follow-up with dentist. Extensively reviewed return precautions. Patient voiced understanding and was discharged in satisfactory condition.  Final Clinical Impressions(s) / ED Diagnoses   Final diagnoses:  Dental abscess     New Prescriptions New Prescriptions   CLINDAMYCIN (CLEOCIN) 150 MG CAPSULE    Take 2 capsules (300 mg total) by mouth 4 (four) times daily.   IBUPROFEN (ADVIL,MOTRIN) 800 MG TABLET    Take 1 tablet (800 mg total) by mouth 3 (three) times daily.   I personally performed the services described in this documentation, which was scribed in my presence. The recorded information has been reviewed and is accurate.     Bradley Spatesachel Hanson Lucille Crichlow, MD 03/01/16 [phone]

## 2016-02-29 NOTE — ED Notes (Signed)
I&D of dental abscess done by Dr Clarene DukeLittle, tol well.

## 2016-03-01 MED ORDER — CLINDAMYCIN HCL 150 MG PO CAPS: 300.0000 mg | ORAL_CAPSULE | Freq: Four times a day (QID) | ORAL | 0 refills | Status: DC

## 2016-03-01 MED ORDER — IBUPROFEN 800 MG PO TABS: 800.0000 mg | ORAL_TABLET | Freq: Three times a day (TID) | ORAL | 0 refills | Status: DC

## 2016-03-01 NOTE — ED Notes (Signed)
Called [facility] and spoke with Marcelino DusterMichelle, given discharge instructions and will be enroute to transport back to facility

## 2016-03-05 LAB — CULTURE, BLOOD (ROUTINE X 2)
CULTURE: NO GROWTH
Culture: NO GROWTH

## 2016-03-19 ENCOUNTER — Encounter (HOSPITAL_BASED_OUTPATIENT_CLINIC_OR_DEPARTMENT_OTHER): Payer: Self-pay | Admitting: *Deleted

## 2016-03-19 ENCOUNTER — Emergency Department (HOSPITAL_BASED_OUTPATIENT_CLINIC_OR_DEPARTMENT_OTHER)
Admission: EM | Admit: 2016-03-19 | Discharge: 2016-03-19 | Disposition: A | Discharge status: Home / Self Care | Payer: Self-pay | Attending: Emergency Medicine | Admitting: Emergency Medicine

## 2016-03-19 DIAGNOSIS — Z79899 Other long term (current) drug therapy: Secondary | ICD-10-CM | POA: Insufficient documentation

## 2016-03-19 DIAGNOSIS — K047 Periapical abscess without sinus: Secondary | ICD-10-CM | POA: Insufficient documentation

## 2016-03-19 DIAGNOSIS — Z87891 Personal history of nicotine dependence: Secondary | ICD-10-CM | POA: Insufficient documentation

## 2016-03-19 DIAGNOSIS — K0889 Other specified disorders of teeth and supporting structures: Secondary | ICD-10-CM

## 2016-03-19 DIAGNOSIS — K029 Dental caries, unspecified: Secondary | ICD-10-CM | POA: Insufficient documentation

## 2016-03-19 MED ORDER — LIDOCAINE HCL (PF) 1 % IJ SOLN
INTRAMUSCULAR | Status: AC
  Administered 2016-03-19: 5 mL
  Filled 2016-03-19: qty 5

## 2016-03-19 MED ORDER — CLINDAMYCIN HCL 150 MG PO CAPS
450.0000 mg | ORAL_CAPSULE | Freq: Once | ORAL | Status: AC
  Administered 2016-03-19: 450 mg via ORAL
  Filled 2016-03-19: qty 3

## 2016-03-19 MED ORDER — BUPIVACAINE-EPINEPHRINE (PF) 0.5% -1:200000 IJ SOLN
INTRAMUSCULAR | Status: AC
  Administered 2016-03-19: 1.8 mL
  Filled 2016-03-19: qty 1.8

## 2016-03-19 MED ORDER — CLINDAMYCIN HCL 150 MG PO CAPS: 450.0000 mg | ORAL_CAPSULE | Freq: Three times a day (TID) | ORAL | 0 refills | Status: AC

## 2016-03-19 MED ORDER — OXYCODONE-ACETAMINOPHEN 5-325 MG PO TABS: 1.0000 | ORAL_TABLET | Freq: Four times a day (QID) | ORAL | 0 refills | Status: DC | PRN

## 2016-03-19 MED ORDER — OXYCODONE-ACETAMINOPHEN 5-325 MG PO TABS
1.0000 | ORAL_TABLET | Freq: Once | ORAL | Status: AC
  Administered 2016-03-19: 1 via ORAL
  Filled 2016-03-19: qty 1

## 2016-03-19 MED FILL — OXYCODONE/APAP 5-325: 5-325 | 5 days supply | Qty: 20 | Fill #0

## 2016-03-19 MED FILL — CLINDAMYCIN HCL 150 MG CAP: 150 | 7 days supply | Qty: 63 | Fill #0

## 2016-03-19 NOTE — ED Notes (Signed)
Confirmed pt has a ride prior to administering percocet

## 2016-03-19 NOTE — ED Notes (Signed)
ED Provider at bedside. 

## 2016-03-19 NOTE — ED Triage Notes (Signed)
Pt c/o left lower dental pain x 1 month. Facial swelling since yesterday. States went to dentist and was started on amoxicillin yesterday. Advised to come to ED if swelling worsened to have abscess drained

## 2016-03-19 NOTE — ED Notes (Signed)
Pt directed to pharmacy to pick up RX. Pt has a ride at bedside 

## 2016-03-19 NOTE — ED Provider Notes (Signed)
[facility]   CSN: [id number] Arrival date & time: 03/19/16  0732     History   Chief Complaint Chief Complaint  Patient presents with  . Dental Pain    HPI Bradley Hanson is a 25 y.o. male with past medical history significant for dental abscess, depression, migraines, and seizures who presents with dental pain. Patient is committed by father and reports that last month, he was diagnosed with a dental abscess. Patient had incision and drainage in the emergency department and was started on clindamycin. Patient reported significant improvement in discomfort and he followed up with a dentist yesterday. Patient says that yesterday morning, his pain and swelling began to return. Dentist put him on amoxicillin and he was instructed to follow-up next week for tooth extraction when the infection had subsided. Dentist instructed him to return to the emergency department today if symptoms continue to worsen or swelling continued. Patient reports that this morning, the pain and swelling continued to worsen and he presents for evaluation.   He does report that he previously had dental infection that became a systemic infection however, he says that in no way does this feel like that. He says that he is not having any difficulty moving his tongue, moving his jaw, and no difficulty breathing or swallowing. He says he does not feel septic and feels that this is just a infection in his jaw at this time. Patient is requesting I&D and pain management.     The history is provided by the patient and medical records. No language interpreter was used.  Dental Pain   This is a recurrent problem. The current episode started more than 2 days ago. The problem occurs constantly. The problem has been gradually worsening. The pain is at a severity of 10/10. The pain is severe. He has tried nothing for the symptoms. The treatment provided no relief.    Past Medical History:  Diagnosis Date    . Anxiety   . Depression   . Depression   . Migraine   . Overdose    OD on Tramadol 2013 [facility]  . Pilonidal cyst   . Pilonidal cyst with abscess 03/20/2011  . Seizures (HCC)    pt estimates last seizure in 2013  . Substance abuse    opoids and triple c"s    Patient Active Problem List   Diagnosis Date Noted  . Intentional heroin overdose (HCC)   . Multiple drug overdose 01/14/2015  . Dental caries 12/22/2013  . 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 Pilonidal cyst with abscess 03/20/2011    Past Surgical History:  Procedure Laterality Date  . CHOLECYSTECTOMY  2006  . INCISE AND DRAIN ABCESS     pilo cyst  . MULTIPLE EXTRACTIONS WITH ALVEOLOPLASTY N/A 12/22/2013   Procedure: EXTRACTION OF TOOTH #30 WITH ALVEOLOPLASTY AND GROSS DEBRIDEMENT OF REMAINING TEETH ;  Surgeon: Charlynne Pander, DDS;  Location: [facility] OR;  Service: Oral Surgery;  Laterality: N/A;       Home Medications    Prior to Admission medications   Medication Sig Start Date End Date Taking? Authorizing Provider  sertraline (ZOLOFT) 100 MG tablet Take 100 mg by mouth daily.   Yes Historical Provider, MD  clindamycin (CLEOCIN) 150 MG capsule Take 2 capsules (300 mg total) by mouth 4 (four) times daily. 03/01/16   Laurence Spates, MD  ibuprofen (ADVIL,MOTRIN) 800 MG tablet Take 1 tablet (800 mg total) by mouth 3 (three) times daily. 03/01/16   Fleet Contras  Pecolia AdesMorgan Little, MD  penicillin v potassium (VEETID) 500 MG tablet Take 1 tablet (500 mg total) by mouth 3 (three) times daily. 05/26/15   Azalia BilisKevin Campos, MD    Family History Family History  Problem Relation Age of Onset  . Heart disease Paternal Grandmother     Social History Social History  Substance Use Topics  . Smoking status: Former Smoker    Packs/day: 0.25    Types: Cigarettes    Quit date: 01/18/2016  . Smokeless tobacco: Never Used  . Alcohol use No     Comment: denies current use     Allergies   Patient has no known allergies.   Review of  Systems Review of Systems  Constitutional: Negative for activity change, chills, diaphoresis, fatigue and fever.  HENT: Positive for dental problem. Negative for congestion, drooling and rhinorrhea.   Eyes: Negative for visual disturbance.  Respiratory: Negative for cough, chest tightness, shortness of breath, wheezing and stridor.   Cardiovascular: Negative for chest pain, palpitations and leg swelling.  Gastrointestinal: Negative for abdominal distention, abdominal pain, blood in stool, constipation, diarrhea, nausea and vomiting.  Genitourinary: Negative for difficulty urinating, dysuria and flank pain.  Musculoskeletal: Negative for back pain and gait problem.  Skin: Negative for rash and wound.  Neurological: Negative for dizziness, weakness, light-headedness and headaches.  Psychiatric/Behavioral: Negative for agitation.  All other systems reviewed and are negative.    Physical Exam Updated Vital Signs BP 138/93 (BP Location: Left Arm)   Pulse 96   Temp 98.2 F (36.8 C) (Oral)   Resp 18   Ht 6\' 3"  (1.905 m)   Wt 260 lb (117.9 kg)   SpO2 95%   BMI 32.50 kg/m   Physical Exam  Constitutional: He appears well-developed and well-nourished.  HENT:  Head:    Right Ear: External ear normal.  Left Ear: External ear normal.  Nose: Nose normal.  Mouth/Throat: No trismus in the jaw. Dental abscesses and dental caries present. No uvula swelling. No oropharyngeal exudate, posterior oropharyngeal edema, posterior oropharyngeal erythema or tonsillar abscesses.    Gum swelling and erythema on left bottom molar area. Tender. No evidence of ludwigs.   Eyes: Conjunctivae are normal.  Neck: Neck supple.  Cardiovascular: Normal rate and regular rhythm.   No murmur heard. Pulmonary/Chest: Effort normal and breath sounds normal. No respiratory distress.  Abdominal: Soft. There is no tenderness.  Musculoskeletal: He exhibits no edema.  Neurological: He is alert.  Skin: Skin is warm  and dry.  Psychiatric: He has a normal mood and affect.  Nursing note and vitals reviewed.    ED Treatments / Results  Labs (all labs ordered are listed, but only abnormal results are displayed) Labs Reviewed - No data to display  EKG  EKG Interpretation None       Radiology No results found.  Procedures Dental Block Date/Time: 03/19/2016 10:20 AM Performed by: Heide ScalesEGELER, Tasheem Elms J Authorized by: Heide ScalesEGELER, Abimael Zeiter J   Consent:    Consent obtained:  Verbal   Consent given by:  Patient   Risks discussed:  Infection, allergic reaction, swelling and unsuccessful block   Alternatives discussed:  No treatment Indications:    Indications: dental abscess   Location:    Block type:  Inferior alveolar   Laterality:  Left Procedure details (see MAR for exact dosages):    Syringe type:  Aspirating dental syringe   Needle gauge:  24 G   Block anesthetic: marcaine and lidocaine.   Injection procedure:  Anatomic  landmarks identified, introduced needle, incremental injection, anatomic landmarks palpated and negative aspiration for blood Post-procedure details:    Outcome:  Pain improved   Patient tolerance of procedure:  Tolerated well, no immediate complications .Marland Kitchen.Incision and Drainage Date/Time: 03/19/2016 10:22 AM Performed by: Heide ScalesEGELER, Alliene Klugh J Authorized by: Heide ScalesEGELER, Emilyann Banka J   Consent:    Consent obtained:  Verbal   Risks discussed:  Damage to other organs, bleeding, incomplete drainage, pain and infection   Alternatives discussed:  No treatment Location:    Type:  Abscess   Size:  1cm   Location:  Head   Head/neck location: dental. Anesthesia (see MAR for exact dosages):    Anesthesia method:  Nerve block Procedure type:    Complexity:  Simple Procedure details:    Incision types:  Stab incision   Incision depth:  Dermal   Scalpel blade:  11   Wound management:  Probed and deloculated and irrigated with saline   Drainage:  Bloody and  purulent   Drainage amount:  Scant   Wound treatment:  Wound left open   Packing materials:  None Post-procedure details:    Patient tolerance of procedure:  Tolerated well, no immediate complications   (including critical care time)  Medications Ordered in ED Medications - No data to display   Initial Impression / Assessment and Plan / ED Course  I have reviewed the triage vital signs and the nursing notes.  Pertinent labs & imaging results that were available during my care of the patient were reviewed by me and considered in my medical decision making (see chart for details).  Clinical Course     Bradley Hanson is a 25 y.o. male with past medical history significant for dental abscess, depression, migraines, and seizures who presents with dental pain.  On exam, patient has tenderness and swelling in the left lower jaw. The gums are erythematous and bulging, suspect underlying abscess. Patient has no other tenderness on his jaw. Patient has no swelling under his tongue. Patient has no difficulty swallowing. Lungs are clear and abdomen is nontender. No numbness on the face. No maxillary tenderness.  Given similarity to prior presentation, suspect dental abscess. Patient will have a nerve block performed, local numbing performed, and I&D will be attempted. Patient will be started on clindamycin  Nerve block was performed, local anesthesia was provided, and incision was made. Patient had some pus sucked out of wound. Wound was irrigated. Patient felt some relief of swelling and pain. Patient was able to express more fluid from incision. Patient given oral clindamycin.  Patient will be given prescription for oral clindamycin for antibiotic treatment of his abscess. Patient will also be given pain medication for the discomfort. Patient will follow-up with his dentist as previously scheduled and understood return precautions for worsening swelling or infection. Patient discharged in good  condition.   Final Clinical Impressions(s) / ED Diagnoses   Final diagnoses:  Dental abscess  Pain, dental    New Prescriptions New Prescriptions   CLINDAMYCIN (CLEOCIN) 150 MG CAPSULE    Take 3 capsules (450 mg total) by mouth 3 (three) times daily.   OXYCODONE-ACETAMINOPHEN (PERCOCET/ROXICET) 5-325 MG TABLET    Take 1 tablet by mouth every 6 (six) hours as needed for severe pain.   Clinical Impression: 1. Dental abscess   2. Pain, dental     Disposition: Discharge  Condition: Good  I have discussed the results, Dx and Tx plan with the pt(& family if present). He/she/they expressed understanding and agree(s)  with the plan. Discharge instructions discussed at great length. Strict return precautions discussed and pt &/or family have verbalized understanding of the instructions. No further questions at time of discharge.    New Prescriptions   CLINDAMYCIN (CLEOCIN) 150 MG CAPSULE    Take 3 capsules (450 mg total) by mouth 3 (three) times daily.   OXYCODONE-ACETAMINOPHEN (PERCOCET/ROXICET) 5-325 MG TABLET    Take 1 tablet by mouth every 6 (six) hours as needed for severe pain.    Follow Up: [facility] [address] [place] [address] [phone]    [facility] [facility] [address] [id number] [address] [place] [address] [phone]  If symptoms worsen     Heide Scales, MD 03/19/16 2035

## 2016-03-19 NOTE — Discharge Instructions (Signed)
Please take your antibiotics as prescribed and pain medicine as needed. Please follow-up with your dentist as scheduled for further management of your dental abscess. Please return to the nearest emergency department if symptoms worsen.

## 2016-06-08 ENCOUNTER — Encounter (HOSPITAL_COMMUNITY): Payer: Self-pay | Admitting: *Deleted

## 2016-06-08 ENCOUNTER — Emergency Department (HOSPITAL_COMMUNITY)
Admission: EM | Admit: 2016-06-08 | Discharge: 2016-06-10 | Disposition: A | Discharge status: Other Acute Inpatient Hospital | Payer: Federal, State, Local not specified - Other | Attending: Emergency Medicine | Admitting: Emergency Medicine

## 2016-06-08 DIAGNOSIS — Z79899 Other long term (current) drug therapy: Secondary | ICD-10-CM | POA: Insufficient documentation

## 2016-06-08 DIAGNOSIS — F332 Major depressive disorder, recurrent severe without psychotic features: Secondary | ICD-10-CM | POA: Insufficient documentation

## 2016-06-08 DIAGNOSIS — F419 Anxiety disorder, unspecified: Secondary | ICD-10-CM | POA: Insufficient documentation

## 2016-06-08 DIAGNOSIS — F1994 Other psychoactive substance use, unspecified with psychoactive substance-induced mood disorder: Secondary | ICD-10-CM

## 2016-06-08 DIAGNOSIS — T450X2A Poisoning by antiallergic and antiemetic drugs, intentional self-harm, initial encounter: Secondary | ICD-10-CM | POA: Insufficient documentation

## 2016-06-08 DIAGNOSIS — Z87891 Personal history of nicotine dependence: Secondary | ICD-10-CM | POA: Insufficient documentation

## 2016-06-08 DIAGNOSIS — T50902A Poisoning by unspecified drugs, medicaments and biological substances, intentional self-harm, initial encounter: Secondary | ICD-10-CM

## 2016-06-08 DIAGNOSIS — F1910 Other psychoactive substance abuse, uncomplicated: Secondary | ICD-10-CM

## 2016-06-08 DIAGNOSIS — T391X2A Poisoning by 4-Aminophenol derivatives, intentional self-harm, initial encounter: Secondary | ICD-10-CM | POA: Insufficient documentation

## 2016-06-08 DIAGNOSIS — F1914 Other psychoactive substance abuse with psychoactive substance-induced mood disorder: Secondary | ICD-10-CM | POA: Insufficient documentation

## 2016-06-08 LAB — COMPREHENSIVE METABOLIC PANEL
ALBUMIN: 4.6 g/dL (ref 3.5–5.0)
ALK PHOS: 50 U/L (ref 38–126)
ALT: 35 U/L (ref 17–63)
AST: 44 U/L — AB (ref 15–41)
Anion gap: 9 (ref 5–15)
BILIRUBIN TOTAL: 0.6 mg/dL (ref 0.3–1.2)
BUN: 12 mg/dL (ref 6–20)
CALCIUM: 9 mg/dL (ref 8.9–10.3)
CO2: 24 mmol/L (ref 22–32)
Chloride: 106 mmol/L (ref 101–111)
Creatinine, Ser: 1.29 mg/dL — ABNORMAL HIGH (ref 0.61–1.24)
GFR calc Af Amer: >60 mL/min (ref >60)
GFR calc non Af Amer: >60 mL/min (ref >60)
GLUCOSE: 89 mg/dL (ref 65–99)
Potassium: 4.5 mmol/L (ref 3.5–5.1)
SODIUM: 139 mmol/L (ref 135–145)
TOTAL PROTEIN: 7.5 g/dL (ref 6.5–8.1)

## 2016-06-08 LAB — CBC
HEMATOCRIT: 44.3 % (ref 39.0–52.0)
HEMOGLOBIN: 14.7 g/dL (ref 13.0–17.0)
MCH: 29.4 pg (ref 26.0–34.0)
MCHC: 33.2 g/dL (ref 30.0–36.0)
MCV: 88.6 fL (ref 78.0–100.0)
Platelets: 287 10*3/uL (ref 150–400)
RBC: 5 MIL/uL (ref 4.22–5.81)
RDW: 14 % (ref 11.5–15.5)
WBC: 9.1 10*3/uL (ref 4.0–10.5)

## 2016-06-08 LAB — ETHANOL: Alcohol, Ethyl (B): 43 mg/dL — ABNORMAL HIGH

## 2016-06-08 LAB — RAPID URINE DRUG SCREEN, HOSP PERFORMED
Amphetamines: NOT DETECTED
Barbiturates: NOT DETECTED
Benzodiazepines: NOT DETECTED
Cocaine: NOT DETECTED
Opiates: POSITIVE — AB
Tetrahydrocannabinol: POSITIVE — AB

## 2016-06-08 LAB — ACETAMINOPHEN LEVEL: Acetaminophen (Tylenol), Serum: <10 ug/mL — ABNORMAL LOW (ref 10–30)

## 2016-06-08 LAB — SALICYLATE LEVEL: Salicylate Lvl: <7 mg/dL (ref 2.8–30.0)

## 2016-06-08 MED ORDER — SODIUM CHLORIDE 0.9 % IV BOLUS (SEPSIS)
1000.0000 mL | Freq: Once | INTRAVENOUS | Status: AC
  Administered 2016-06-08: 1000 mL via INTRAVENOUS

## 2016-06-08 NOTE — ED Provider Notes (Addendum)
[facility]-EMER[facility]Y DEPT Provider Note   CSN: [id number] Arrival date & time: 06/08/16  2233     History   Chief Complaint Chief Complaint  Patient presents with  . IVC  . Ingestion    HPI Bradley Hanson is a 26 y.o. male.  Patient with hx substance abuse, c/o overdose earlier today. States took a total of 40 coricidin cough and cold tablets, the last of which was at 6 pm tonight. Pt poor historian - not answer many questions - level 5 caveat. Denies other ingestion. Has hx substance abuse, including otc cold and flu medication - father says pt will abuse any drug he can get his hands on. Parents also concerned with previously undiagnosed mental illness - father indicating has been acting manic, delusional at times, and at other times depressed.  Pt denies SI.  Occasional etoh use/abuse, denies daily use.    The history is provided by the patient and a parent. The history is limited by the condition of the patient.  Ingestion  Pertinent negatives include no chest pain and no headaches.    Past Medical History:  Diagnosis Date  . Anxiety   . Depression   . Depression   . Migraine   . Overdose    OD on Tramadol 2013 [facility]  . Pilonidal cyst   . Pilonidal cyst with abscess 03/20/2011  . Seizures (HCC)    pt estimates last seizure in 2013  . Substance abuse    opoids and triple c"s    Patient Active Problem List   Diagnosis Date Noted  . Intentional heroin overdose (HCC)   . Multiple drug overdose 01/14/2015  . Dental caries 12/22/2013  . 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 Pilonidal cyst with abscess 03/20/2011    Past Surgical History:  Procedure Laterality Date  . CHOLECYSTECTOMY  2006  . INCISE AND DRAIN ABCESS     pilo cyst  . MULTIPLE EXTRACTIONS WITH ALVEOLOPLASTY N/A 12/22/2013   Procedure: EXTRACTION OF TOOTH #30 WITH ALVEOLOPLASTY AND GROSS DEBRIDEMENT OF REMAINING TEETH ;  Surgeon: Charlynne Pander, DDS;  Location: [facility];  Service: Oral Surgery;  Laterality: N/A;         Home Medications    Prior to Admission medications   Medication Sig Start Date End Date Taking? Authorizing Provider  clindamycin (CLEOCIN) 150 MG capsule Take 2 capsules (300 mg total) by mouth 4 (four) times daily. 03/01/16   Laurence Spates, MD  ibuprofen (ADVIL,MOTRIN) 800 MG tablet Take 1 tablet (800 mg total) by mouth 3 (three) times daily. 03/01/16   Laurence Spates, MD  oxyCODONE-acetaminophen (PERCOCET/ROXICET) 5-325 MG tablet Take 1 tablet by mouth every 6 (six) hours as needed for severe pain. 03/19/16   Canary Brim Tegeler, MD  penicillin v potassium (VEETID) 500 MG tablet Take 1 tablet (500 mg total) by mouth 3 (three) times daily. 05/26/15   Azalia Bilis, MD  sertraline (ZOLOFT) 100 MG tablet Take 100 mg by mouth daily.    Historical Provider, MD    Family History Family History  Problem Relation Age of Onset  . Heart disease Paternal Grandmother     Social History Social History  Substance Use Topics  . Smoking status: Former Smoker    Packs/day: 0.25    Types: Cigarettes    Quit date: 01/18/2016  . Smokeless tobacco: Never Used  . Alcohol use No     Comment: denies current use     Allergies   Patient has no known allergies.  Review of Systems Review of Systems  Unable to perform ROS: Psychiatric disorder  Constitutional: Negative for fever.  Cardiovascular: Negative for chest pain.  Gastrointestinal: Negative for vomiting.  Neurological: Negative for headaches.  level 5 caveat.    Physical Exam Updated Vital Signs BP 156/99 (BP Location: Right Arm)   Pulse 110   Temp 98.8 F (37.1 C) (Oral)   Resp 22   SpO2 96%   Physical Exam  Constitutional: He appears well-developed and well-nourished. No distress.  HENT:  Mouth/Throat: Oropharynx is clear and moist.  Eyes: Conjunctivae are normal. Pupils are equal, round, and reactive to light.  Neck: Neck supple. No tracheal deviation present.  Cardiovascular: Regular rhythm,  normal heart sounds and intact distal pulses.  Exam reveals no gallop and no friction rub.   No murmur heard. Pulmonary/Chest: Effort normal and breath sounds normal. No accessory muscle usage. No respiratory distress.  Abdominal: Soft. Bowel sounds are normal. He exhibits no distension. There is no tenderness.  Musculoskeletal: He exhibits no edema.  Neurological: He is alert.  Slow to respond. Speech fluent. Moves bil extremities purposefully w good strength.   Skin: Skin is warm and dry. He is not diaphoretic.  Psychiatric:  Flat affect, depressed mood.   Nursing note and vitals reviewed.    ED Treatments / Results  Labs (all labs ordered are listed, but only abnormal results are displayed) Results for orders placed or performed during the hospital encounter of 06/08/16  Comprehensive metabolic panel  Result Value Ref Range   Sodium 139 135 - 145 mmol/L   Potassium 4.5 3.5 - 5.1 mmol/L   Chloride 106 101 - 111 mmol/L   CO2 24 22 - 32 mmol/L   Glucose, Bld 89 65 - 99 mg/dL   BUN 12 6 - 20 mg/dL   Creatinine, Ser 1.61 (H) 0.61 - 1.24 mg/dL   Calcium 9.0 8.9 - 09.6 mg/dL   Total Protein 7.5 6.5 - 8.1 g/dL   Albumin 4.6 3.5 - 5.0 g/dL   AST 44 (H) 15 - 41 U/L   ALT 35 17 - 63 U/L   Alkaline Phosphatase 50 38 - 126 U/L   Total Bilirubin 0.6 0.3 - 1.2 mg/dL   GFR calc non Af Amer >60 >60 mL/min   GFR calc Af Amer >60 >60 mL/min   Anion gap 9 5 - 15  Ethanol  Result Value Ref Range   Alcohol, Ethyl (B) 43 (H) <5 mg/dL  Salicylate level  Result Value Ref Range   Salicylate Lvl <7.0 2.8 - 30.0 mg/dL  Acetaminophen level  Result Value Ref Range   Acetaminophen (Tylenol), Serum <10 (L) 10 - 30 ug/mL  cbc  Result Value Ref Range   WBC 9.1 4.0 - 10.5 K/uL   RBC 5.00 4.22 - 5.81 MIL/uL   Hemoglobin 14.7 13.0 - 17.0 g/dL   HCT 04.5 40.9 - 81.1 %   MCV 88.6 78.0 - 100.0 fL   MCH 29.4 26.0 - 34.0 pg   MCHC 33.2 30.0 - 36.0 g/dL   RDW 91.4 78.2 - 95.6 %   Platelets 287 150 -  400 K/uL  Rapid urine drug screen (hospital performed)  Result Value Ref Range   Opiates POSITIVE (A) NONE DETECTED   Cocaine NONE DETECTED NONE DETECTED   Benzodiazepines NONE DETECTED NONE DETECTED   Amphetamines NONE DETECTED NONE DETECTED   Tetrahydrocannabinol POSITIVE (A) NONE DETECTED   Barbiturates NONE DETECTED NONE DETECTED   EKG  EKG Interpretation  Date/Time:  Saturday June 08 2016 23:07:20 EST Ventricular Rate:  112 PR Interval:    QRS Duration: 78 QT Interval:  316 QTC Calculation: 432 R Axis:   73 Text Interpretation:  Sinus tachycardia Confirmed by Denton LankSTEINL  MD, Caryn BeeKEVIN ([id number]) on 06/08/2016 11:56:15 PM       Radiology No results found.  Procedures Procedures (including critical care time)  Medications Ordered in ED Medications - No data to display   Initial Impression / Assessment and Plan / ED Course  I have reviewed the triage vital signs and the nursing notes.  Pertinent labs & imaging results that were available during my care of the patient were reviewed by me and considered in my medical decision making (see chart for details).  Iv ns bolus. Continuous pulse ox and monitor. o2 [place].  Stat labs sent.  Poison control contacted - they recommend monitor for the next few hours.  Reviewed nursing notes and prior charts for additional history.   Additional hx from parents.   BH team consulted for evaluation [facility]  - feel likely patient will require psychiatric admission.   Approximately 4.5-5 hrs post ingestion, acetaminophen level neg.   Recheck, pt alert, no resp depression, no emesis, hr 96.   [facility].     Final Clinical Impressions(s) / ED Diagnoses   Final diagnoses:  None    New Prescriptions New Prescriptions   No medications on file        Cathren LaineKevin Ahnya Akre, MD 06/09/16 [id number]

## 2016-06-08 NOTE — ED Notes (Signed)
Pt's parents at bedside. Pt states he took about 3 packs of Coricidin cold medicine today.  Last ingesting some about 5 or 6 p.m.  Pt is diaphoretic.  States he took the medicine to "just to get high I guess"
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
## 2016-06-08 NOTE — ED Notes (Signed)
Bed: WA17 Expected date:  Expected time:  Means of arrival:  Comments: Hold for TCU 33

## 2016-06-08 NOTE — ED Notes (Addendum)
Poison Control Contacted.   Predicts possibility of drowsiness or hyperexcitability  Monitor for Anticholinergic effects, tachycardia, HTN, and QRS Widening  Recommends fluids, EKG, and Tylenol Level  May take 4-6 more hours for pt to metabolize drugs  Can be medically cleared when asymptomatic
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
## 2016-06-09 MED ORDER — ONDANSETRON 4 MG PO TBDP: 4.0000 mg | ORAL_TABLET | Freq: Four times a day (QID) | ORAL | Status: DC | PRN

## 2016-06-09 MED ORDER — ADULT MULTIVITAMIN W/MINERALS CH
1.0000 | ORAL_TABLET | Freq: Every day | ORAL | Status: DC
  Administered 2016-06-09 – 2016-06-10 (×2): 1 via ORAL
  Filled 2016-06-09 (×2): qty 1

## 2016-06-09 MED ORDER — LORAZEPAM 1 MG PO TABS: 1.0000 mg | ORAL_TABLET | Freq: Four times a day (QID) | ORAL | Status: DC | PRN

## 2016-06-09 MED ORDER — LORAZEPAM 1 MG PO TABS: 1.0000 mg | ORAL_TABLET | Freq: Three times a day (TID) | ORAL | Status: DC

## 2016-06-09 MED ORDER — THIAMINE HCL 100 MG/ML IJ SOLN
100.0000 mg | Freq: Once | INTRAMUSCULAR | Status: DC
  Filled 2016-06-09: qty 2

## 2016-06-09 MED ORDER — LORAZEPAM 1 MG PO TABS
1.0000 mg | ORAL_TABLET | Freq: Four times a day (QID) | ORAL | Status: DC
  Administered 2016-06-09 (×3): 1 mg via ORAL
  Filled 2016-06-09 (×3): qty 1

## 2016-06-09 MED ORDER — LORAZEPAM 1 MG PO TABS: 1.0000 mg | ORAL_TABLET | Freq: Every day | ORAL | Status: DC

## 2016-06-09 MED ORDER — HYDROXYZINE HCL 25 MG PO TABS: 25.0000 mg | ORAL_TABLET | Freq: Four times a day (QID) | ORAL | Status: DC | PRN

## 2016-06-09 MED ORDER — VITAMIN B-1 100 MG PO TABS
100.0000 mg | ORAL_TABLET | Freq: Every day | ORAL | Status: DC
  Administered 2016-06-10: 100 mg via ORAL
  Filled 2016-06-09: qty 1

## 2016-06-09 MED ORDER — SODIUM CHLORIDE 0.9 % IV BOLUS (SEPSIS)
1000.0000 mL | Freq: Once | INTRAVENOUS | Status: AC
  Administered 2016-06-08: 1000 mL via INTRAVENOUS

## 2016-06-09 MED ORDER — LOPERAMIDE HCL 2 MG PO CAPS: 2.0000 mg | ORAL_CAPSULE | ORAL | Status: DC | PRN

## 2016-06-09 MED ORDER — LORAZEPAM 1 MG PO TABS: 1.0000 mg | ORAL_TABLET | Freq: Two times a day (BID) | ORAL | Status: DC

## 2016-06-09 NOTE — Progress Notes (Signed)
CSW filed patient's examination paperwork into IVC logbook.    Airam Heidecker, LCSWA [facility]  Clinical Social Worker [phone] 

## 2016-06-09 NOTE — ED Notes (Signed)
Patient arrived to unit and is pleasant and cooperative on assessment. Pt denies suicidal or homicidal ideations at this time and states "I just want to get help right now. I need to be admitted". Pt with no signs or symptoms of distress noted at this time.

## 2016-06-09 NOTE — [facility] Counselor (Signed)
Clinician received a call from [facility] at [facility] expressing the pt's referrral is under review.   Gwinda Passereylese D Bennett, MS, [facility], [facility] Triage Specialist [phone]

## 2016-06-09 NOTE — ED Notes (Signed)
SBAR Report received from previous nurse. Pt received calm and visible on unit. Pt denies current SI/ HI, A/V H, depression, anxiety, or pain at this time, and appears otherwise stable and free of distress. Pt reminded of camera surveillance, q 15 min rounds, and rules of the milieu. Will continue to assess. 

## 2016-06-09 NOTE — ED Notes (Signed)
Earna CoderZachary has been pleasant and cooperative this shift.  He has stayed in his room most of the day.  He is taking all medications as ordered.  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  [facility] and started using again after 1 or 2 days.  He believes he needs to go to a transitional house to help him get back to living on his own without the drugs.  Appetite is good and he is drinking plenty of fluids.

## 2016-06-09 NOTE — BH Assessment (Signed)
Pt has been faxed to the following inpt facilities for [facility]: [facility], [facility], [facility], [address] [facility] [facility], [address], [address], [facility]   Aquicha Duff, MSW, [address]
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
## 2016-06-10 ENCOUNTER — Encounter (HOSPITAL_COMMUNITY): Payer: Self-pay | Admitting: *Deleted

## 2016-06-10 ENCOUNTER — Inpatient Hospital Stay (HOSPITAL_COMMUNITY)
Admission: AD | Admit: 2016-06-10 | Discharge: 2016-06-15 | DRG: 885 | Disposition: A | Discharge status: Home / Self Care | Payer: Federal, State, Local not specified - Other | Attending: Psychiatry | Admitting: Psychiatry

## 2016-06-10 DIAGNOSIS — F1910 Other psychoactive substance abuse, uncomplicated: Secondary | ICD-10-CM | POA: Diagnosis present

## 2016-06-10 DIAGNOSIS — F411 Generalized anxiety disorder: Secondary | ICD-10-CM | POA: Diagnosis present

## 2016-06-10 DIAGNOSIS — Z915 Personal history of self-harm: Secondary | ICD-10-CM

## 2016-06-10 DIAGNOSIS — Z9049 Acquired absence of other specified parts of digestive tract: Secondary | ICD-10-CM | POA: Diagnosis not present

## 2016-06-10 DIAGNOSIS — T1491XA Suicide attempt, initial encounter: Secondary | ICD-10-CM | POA: Diagnosis not present

## 2016-06-10 DIAGNOSIS — T401X2A Poisoning by heroin, intentional self-harm, initial encounter: Secondary | ICD-10-CM

## 2016-06-10 DIAGNOSIS — Z560 Unemployment, unspecified: Secondary | ICD-10-CM | POA: Diagnosis not present

## 2016-06-10 DIAGNOSIS — F1994 Other psychoactive substance use, unspecified with psychoactive substance-induced mood disorder: Secondary | ICD-10-CM | POA: Diagnosis not present

## 2016-06-10 DIAGNOSIS — F1120 Opioid dependence, uncomplicated: Secondary | ICD-10-CM | POA: Diagnosis not present

## 2016-06-10 DIAGNOSIS — F419 Anxiety disorder, unspecified: Secondary | ICD-10-CM | POA: Diagnosis not present

## 2016-06-10 DIAGNOSIS — Z8249 Family history of ischemic heart disease and other diseases of the circulatory system: Secondary | ICD-10-CM

## 2016-06-10 DIAGNOSIS — F332 Major depressive disorder, recurrent severe without psychotic features: Secondary | ICD-10-CM | POA: Diagnosis present

## 2016-06-10 DIAGNOSIS — Z87891 Personal history of nicotine dependence: Secondary | ICD-10-CM

## 2016-06-10 DIAGNOSIS — Z79891 Long term (current) use of opiate analgesic: Secondary | ICD-10-CM

## 2016-06-10 DIAGNOSIS — F1123 Opioid dependence with withdrawal: Secondary | ICD-10-CM | POA: Diagnosis present

## 2016-06-10 DIAGNOSIS — Z79899 Other long term (current) drug therapy: Secondary | ICD-10-CM

## 2016-06-10 DIAGNOSIS — F322 Major depressive disorder, single episode, severe without psychotic features: Secondary | ICD-10-CM | POA: Diagnosis present

## 2016-06-10 LAB — BASIC METABOLIC PANEL
Anion gap: 7 (ref 5–15)
BUN: 15 mg/dL (ref 6–20)
CALCIUM: 9.4 mg/dL (ref 8.9–10.3)
CO2: 29 mmol/L (ref 22–32)
Chloride: 104 mmol/L (ref 101–111)
Creatinine, Ser: 1.07 mg/dL (ref 0.61–1.24)
GFR calc Af Amer: >60 mL/min (ref >60)
Glucose, Bld: 116 mg/dL — ABNORMAL HIGH (ref 65–99)
POTASSIUM: 4.6 mmol/L (ref 3.5–5.1)
SODIUM: 140 mmol/L (ref 135–145)

## 2016-06-10 LAB — TSH: TSH: 0.699 u[IU]/mL (ref 0.350–4.500)

## 2016-06-10 MED ORDER — ALUM & MAG HYDROXIDE-SIMETH 200-200-20 MG/5ML PO SUSP: 30.0000 mL | ORAL | Status: DC | PRN

## 2016-06-10 MED ORDER — LOPERAMIDE HCL 2 MG PO CAPS: 2.0000 mg | ORAL_CAPSULE | ORAL | Status: AC | PRN

## 2016-06-10 MED ORDER — ADULT MULTIVITAMIN W/MINERALS CH
1.0000 | ORAL_TABLET | Freq: Every day | ORAL | Status: DC
  Administered 2016-06-11 – 2016-06-15 (×5): 1 via ORAL
  Filled 2016-06-10 (×7): qty 1

## 2016-06-10 MED ORDER — MAGNESIUM HYDROXIDE 400 MG/5ML PO SUSP: 30.0000 mL | Freq: Every day | ORAL | Status: DC | PRN

## 2016-06-10 MED ORDER — VITAMIN B-1 100 MG PO TABS
100.0000 mg | ORAL_TABLET | Freq: Every day | ORAL | Status: DC
  Administered 2016-06-11 – 2016-06-15 (×5): 100 mg via ORAL
  Filled 2016-06-10 (×7): qty 1

## 2016-06-10 MED ORDER — ACETAMINOPHEN 325 MG PO TABS
650.0000 mg | ORAL_TABLET | Freq: Four times a day (QID) | ORAL | Status: DC | PRN
  Administered 2016-06-13 – 2016-06-14 (×2): 650 mg via ORAL
  Filled 2016-06-10 (×2): qty 2

## 2016-06-10 MED ORDER — HYDROXYZINE HCL 25 MG PO TABS
25.0000 mg | ORAL_TABLET | Freq: Four times a day (QID) | ORAL | Status: DC | PRN
  Administered 2016-06-11 – 2016-06-12 (×3): 25 mg via ORAL
  Filled 2016-06-10 (×3): qty 1

## 2016-06-10 MED ORDER — ONDANSETRON 4 MG PO TBDP: 4.0000 mg | ORAL_TABLET | Freq: Four times a day (QID) | ORAL | Status: DC | PRN

## 2016-06-10 MED ORDER — LORAZEPAM 1 MG PO TABS
1.0000 mg | ORAL_TABLET | Freq: Four times a day (QID) | ORAL | Status: DC | PRN
  Administered 2016-06-11 (×2): 1 mg via ORAL
  Filled 2016-06-10 (×2): qty 1

## 2016-06-10 NOTE — ED Notes (Signed)
Pt transported to [facility] by [employer]. All belongings returned to pt who signed for same. Pt remained calm and cooperative.  

## 2016-06-10 NOTE — Progress Notes (Signed)
Patient ID: Jolee EwingZachary Hanson, male   DOB: 1990/06/01, 26 y.o.   MRN: [id number] D: client in bed this shift, but is pleasant, reports goal: "trying to figure things out psychologically" "get help with addiction" A: Writer provided emotional support, reviewed medications encouraged client to report symptoms. Staff will monitor q3615min for safety. R: Client is safe on the unit, did not attend group.

## 2016-06-10 NOTE — BH Assessment (Signed)
[facility]HH Assessment Progress Note  Per Thedore MinsMojeed Akintayo, MD, this pt requires psychiatric hospitalization.  Berneice Heinrichina Tate, RN, [facility].  Pt presents under IVC initiated by his father, and upheld by Neysa Hottereina Hisada, MD, and [facility].  Pt's nurse, Diane, has been notified, and agrees to call report to [phone].  Pt is to be transported via [employer].   Doylene Canninghomas Youcef Klas, MA Triage Specialist [phone]

## 2016-06-10 NOTE — Progress Notes (Signed)
Patient did not attend wrap up group. 

## 2016-06-10 NOTE — Progress Notes (Signed)
Admission note:  Patient is a 26 yo male that is IVC's by his father due to an overdose of 40 coriciden cough and and cold tablets.  Patient states that he took 3 packs of "cough medicine along with a bottle of wine Friday."  Patient states that he started having "paranoid delusions and I was afraid.  I felt like I was getting set up to be killed."  Patient states that when he drinks, he usually "drinks a bottle of wine or 40 ounce beers."  He does not drink every day.  He states, "I used to have a terrible opiod problem and I've been in and out rehabs for the last couple of years."  Patient states he took a suboxone "a couple of weeks ago and that's why I was probably positive for opiates."  He denies any blackouts from alcohol.  He states, "I was hoping to get into [facility], however, I can't because I became close to a male that is there and I started having feelings for her."  Patient states he is on probation for a misdemeanor larceny charge and he failed a drug test and was placed on "extended probation."  Patient denies any thoughts of self harm.  He [place] in [place] and they are supportive.  He states he has been using "some type of drugs since I was 12 and staying sober no more than 60 days."  He has attended NA meetings and states, "I know what I'm supposed to do.  I need longer term treatment."  He was positive for opiods and THC, which he states he only smokes "occasionally."  Patient was sweating during admission.  His speech was rapid and pressured.  His BAL upon arrival to ED was 43.  He states that he received scripts for zoloft through [facility].  He states he is unemployed and is finding it difficult to find employment.  He complains of insomnia, depression, anxiety and hopelessness. He has a hx of seizures with the last one in 2013.  He has hx of opioid and triple C abuse.  He also has a hx of of overdose of tramdol in 2013 and was [facility].  Patient has no prior  ad[facility] to [facility].  Patient has also had [facility] to [facility] and [facility].  Patient was oriented to room and unit.

## 2016-06-10 NOTE — Tx Team (Signed)
Initial Treatment Plan 06/10/2016 4:12 PM Bradley EwingZachary Hanson ZOX:[id number]    PATIENT STRESSORS: Financial difficulties Legal issue Marital or family conflict Occupational concerns Substance abuse   PATIENT STRENGTHS: Average or above average intelligence Communication skills Motivation for treatment/growth Physical Health Supportive family/friends   PATIENT IDENTIFIED PROBLEMS: Substance abuse  "Need to figure out what's going on."  "I need longer term treatment."  Continued drug abuse with no substantial sobriety  Anxiety  Conflict with family  Unemployment  Legal Issues       DISCHARGE CRITERIA:  Improved stabilization in mood, thinking, and/or behavior Motivation to continue treatment in a less acute level of care Need for constant or close observation no longer present Verbal commitment to aftercare and medication compliance Withdrawal symptoms are absent or subacute and managed without 24-hour nursing intervention  PRELIMINARY DISCHARGE PLAN: Attend 12-step recovery group Outpatient therapy Return to previous living arrangement  PATIENT/FAMILY INVOLVEMENT: This treatment plan has been presented to and reviewed with the patient, Bradley BonZachary FoxThe patient and family have been given the opportunity to ask questions and make suggestions.  Bradley Hanson, Bradley Maske Evans, RN 06/10/2016, 4:12 PM
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
## 2016-06-10 NOTE — ED Notes (Signed)
Angel in phlebotomy called for AM labs due.

## 2016-06-11 DIAGNOSIS — F419 Anxiety disorder, unspecified: Secondary | ICD-10-CM

## 2016-06-11 DIAGNOSIS — F1120 Opioid dependence, uncomplicated: Secondary | ICD-10-CM

## 2016-06-11 DIAGNOSIS — Z79899 Other long term (current) drug therapy: Secondary | ICD-10-CM

## 2016-06-11 DIAGNOSIS — F332 Major depressive disorder, recurrent severe without psychotic features: Principal | ICD-10-CM

## 2016-06-11 DIAGNOSIS — F1994 Other psychoactive substance use, unspecified with psychoactive substance-induced mood disorder: Secondary | ICD-10-CM

## 2016-06-11 MED ORDER — SERTRALINE HCL 100 MG PO TABS
100.0000 mg | ORAL_TABLET | Freq: Every day | ORAL | Status: DC
  Administered 2016-06-11 – 2016-06-15 (×5): 100 mg via ORAL
  Filled 2016-06-11: qty 7
  Filled 2016-06-11 (×6): qty 1

## 2016-06-11 NOTE — Progress Notes (Signed)
Recreation Therapy Notes  Animal-Assisted Activity (AAA) Program Checklist/Progress Notes Patient Eligibility Criteria Checklist & Daily Group note for Rec TxIntervention  Date: 03.06.2018 Time: 2:50pm Location: [address] Dayroom    AAA/T Program Assumption of Risk Form signed by Patient/ or Parent Legal Guardian Yes  Patient is free of allergies or sever asthma Yes  Patient reports no fear of animals Yes  Patient reports no history of cruelty to animals Yes  Patient understands his/her participation is voluntary Yes  Behavioral Response: Did not attend.   Dex Blakely L Lakendria Nicastro, LRT/CTRS        Shandell Giovanni L 06/11/2016 3:05 PM 

## 2016-06-11 NOTE — Plan of Care (Signed)
Problem: Safety: Goal: Periods of time without injury will increase Outcome: Progressing Periods of time without injury will increase AEB q615min safety checks. Client remains safe on the unit.

## 2016-06-11 NOTE — [facility] Group Notes (Signed)
[facility] LCSW Group Therapy 06/11/2016 1:15 PM  Type of Therapy: Group Therapy- Feelings about Diagnosis  Participation Level: Pt invited. Did not attend.  Jonathon JordanLynn B Cher Franzoni, MSW, LCSWA 06/11/2016 3:20 PM

## 2016-06-11 NOTE — [facility] Group Notes (Signed)
[facility] Group Notes:  (Nursing/MHT/Case Management/Adjunct)  Date:  06/11/2016  Time:  0845 am  Type of Therapy:  Psychoeducational Skills  Participation Level:  Active  Participation Quality:  Appropriate and Attentive  Affect:  Appropriate  Cognitive:  Alert and Appropriate  Insight:  Appropriate  Engagement in Group:  Engaged  Modes of Intervention:  Support  Summary of Progress/Problems: Patient was engaged during group and asked appropriate questions.    Cranford MonBeaudry, Markeda Narvaez Evans 06/11/2016, 10:50 AM

## 2016-06-11 NOTE — Progress Notes (Signed)
D: Bradley Hanson's CIWA this a.m was 10 (see flowsheet). He was tremulous, diaphoretic, and slightly anxious. He reported He denied SI, HI, and AVH. He has been appropriate and attended a.m group. On his self inventory, he reported fair sleep, fair appetite, normal energy level, and good concentration level. He rated his depression 5/10, hopelessness 3/10, and anxiety 7/10. He also reported diarrhea, cravings, and racing thoughts.   A: Meds given as ordered, including PRNs (see MAR). Q15 safety checks maintained. Support/encouragement offered.  R: Pt remains free from harm and continues with treatment. Will continue to monitor for needs/safety.

## 2016-06-11 NOTE — Tx Team (Signed)
Interdisciplinary Treatment and Diagnostic Plan Update 06/11/2016 Time of Session: 9:30am  Bradley Hanson  MRN: [id number]  Principal Diagnosis: depression, anxiety, substance abuse  Secondary Diagnoses: Active Problems:   Major depressive disorder, single episode, severe without psychosis ([place])   Current Medications:  Current Facility-Administered Medications  Medication Dose Route Frequency Provider Last Rate Last Dose  . acetaminophen (TYLENOL) tablet 650 mg  650 mg Oral Q6H PRN Patrecia Pour, NP      . alum & mag hydroxide-simeth (MAALOX/MYLANTA) 200-200-20 MG/5ML suspension 30 mL  30 mL Oral Q4H PRN Patrecia Pour, NP      . hydrOXYzine (ATARAX/VISTARIL) tablet 25 mg  25 mg Oral Q6H PRN Patrecia Pour, NP   25 mg at 06/11/16 1119  . loperamide (IMODIUM) capsule 2-4 mg  2-4 mg Oral PRN Patrecia Pour, NP      . LORazepam (ATIVAN) tablet 1 mg  1 mg Oral Q6H PRN Patrecia Pour, NP   1 mg at 06/11/16 1452  . magnesium hydroxide (MILK OF MAGNESIA) suspension 30 mL  30 mL Oral Daily PRN Patrecia Pour, NP      . multivitamin with minerals tablet 1 tablet  1 tablet Oral Daily Patrecia Pour, NP   1 tablet at 06/11/16 [phone]  . ondansetron (ZOFRAN-ODT) disintegrating tablet 4 mg  4 mg Oral Q6H PRN Patrecia Pour, NP      . sertraline (ZOLOFT) tablet 100 mg  100 mg Oral Daily Sharma Covert, MD   100 mg at 06/11/16 1449  . thiamine (VITAMIN B-1) tablet 100 mg  100 mg Oral Daily Patrecia Pour, NP   100 mg at 06/11/16 8295    PTA Medications: Prescriptions Prior to Admission  Medication Sig Dispense Refill Last Dose  . clindamycin (CLEOCIN) 150 MG capsule Take 2 capsules (300 mg total) by mouth 4 (four) times daily. (Patient not taking: Reported on 06/08/2016) 56 capsule 0 Not Taking at Unknown time  . ibuprofen (ADVIL,MOTRIN) 800 MG tablet Take 1 tablet (800 mg total) by mouth 3 (three) times daily. (Patient not taking: Reported on 06/08/2016) 12 tablet 0 Not Taking at Unknown time  .  penicillin v potassium (VEETID) 500 MG tablet Take 1 tablet (500 mg total) by mouth 3 (three) times daily. (Patient not taking: Reported on 06/08/2016) 21 tablet 0 Not Taking at Unknown time  . sertraline (ZOLOFT) 100 MG tablet Take 100 mg by mouth daily.   06/08/2016 at Unknown time    Treatment Modalities: Medication Management, Group therapy, Case management,  1 to 1 session with clinician, Psychoeducation, Recreational therapy.  Patient Stressors: Arts development officer issue Marital or family conflict Occupational concerns Substance abuse Patient Strengths: Average or above average Architect for treatment/growth Physical Health Supportive family/friends  Physician Treatment Plan for Primary Diagnosis: depression, anxiety, substance abuse Long Term Goal(s): Improvement in symptoms so as ready for discharge Short Term Goals: Ability to identify changes in lifestyle to reduce recurrence of condition will improve Ability to verbalize feelings will improve Ability to disclose and discuss suicidal ideas Ability to demonstrate self-control will improve Ability to identify and develop effective coping behaviors will improve Ability to maintain clinical measurements within normal limits will improve Compliance with prescribed medications will improve Ability to identify triggers associated with substance abuse/mental health issues will improve Ability to identify changes in lifestyle to reduce recurrence of condition will improve Ability to verbalize feelings will improve Ability to disclose and discuss suicidal ideas  Ability to demonstrate self-control will improve Ability to identify and develop effective coping behaviors will improve Ability to maintain clinical measurements within normal limits will improve Compliance with prescribed medications will improve Ability to identify triggers associated with substance abuse/mental health issues will  improve  Medication Management: Evaluate patient's response, side effects, and tolerance of medication regimen.  Therapeutic Interventions: 1 to 1 sessions, Unit Group sessions and Medication administration.  Evaluation of Outcomes: Not Met  Physician Treatment Plan for Secondary Diagnosis: Active Problems:   Major depressive disorder, single episode, severe without psychosis ([place])  Long Term Goal(s): Improvement in symptoms so as ready for discharge  Short Term Goals: Ability to identify changes in lifestyle to reduce recurrence of condition will improve Ability to verbalize feelings will improve Ability to disclose and discuss suicidal ideas Ability to demonstrate self-control will improve Ability to identify and develop effective coping behaviors will improve Ability to maintain clinical measurements within normal limits will improve Compliance with prescribed medications will improve Ability to identify triggers associated with substance abuse/mental health issues will improve Ability to identify changes in lifestyle to reduce recurrence of condition will improve Ability to verbalize feelings will improve Ability to disclose and discuss suicidal ideas Ability to demonstrate self-control will improve Ability to identify and develop effective coping behaviors will improve Ability to maintain clinical measurements within normal limits will improve Compliance with prescribed medications will improve Ability to identify triggers associated with substance abuse/mental health issues will improve  Medication Management: Evaluate patient's response, side effects, and tolerance of medication regimen.  Therapeutic Interventions: 1 to 1 sessions, Unit Group sessions and Medication administration.  Evaluation of Outcomes: Not Met  RN Treatment Plan for Primary Diagnosis: depression, anxiety, substance abuse Long Term Goal(s): Knowledge of disease and therapeutic regimen to maintain health  will improve  Short Term Goals: Ability to verbalize feelings will improve, Ability to identify and develop effective coping behaviors will improve and Compliance with prescribed medications will improve  Medication Management: RN will administer medications as ordered by provider, will assess and evaluate patient's response and provide education to patient for prescribed medication. RN will report any adverse and/or side effects to prescribing provider.  Therapeutic Interventions: 1 on 1 counseling sessions, Psychoeducation, Medication administration, Evaluate responses to treatment, Monitor vital signs and CBGs as ordered, Perform/monitor CIWA, COWS, AIMS and Fall Risk screenings as ordered, Perform wound care treatments as ordered.  Evaluation of Outcomes: Not Met  LCSW Treatment Plan for Primary Diagnosis: depression, anxiety, substance abuse Long Term Goal(s): Safe transition to appropriate next level of care at discharge, Engage patient in therapeutic group addressing interpersonal concerns. Short Term Goals: Engage patient in aftercare planning with referrals and resources, Facilitate patient progression through stages of change regarding substance use diagnoses and concerns, Identify triggers associated with mental health/substance abuse issues and Increase skills for wellness and recovery  Therapeutic Interventions: Assess for all discharge needs, 1 to 1 time with Social worker, Explore available resources and support systems, Assess for adequacy in community support network, Educate family and significant other(s) on suicide prevention, Complete Psychosocial Assessment, Interpersonal group therapy.  Evaluation of Outcomes: Not Met  Progress in Treatment: Attending groups: Pt is new to milieu, continuing to assess  Participating in groups: Pt is new to milieu, continuing to assess  Taking medication as prescribed: Yes, MD continues to assess for medication changes as needed Toleration  medication: Yes, no side effects reported at this time Family/Significant other contact made: No, CSW assessing for appropriate contact  Patient understands diagnosis: Continuing to assess Discussing patient identified problems/goals with staff: Yes Medical problems stabilized or resolved: Yes Denies suicidal/homicidal ideation:  Issues/concerns per patient self-inventory: None Other: N/A  New problem(s) identified: None identified at this time.   New Short Term/Long Term Goal(s): None identified at this time.   Discharge Plan or Barriers: CSW still assessing for appropriate plan.  Reason for Continuation of Hospitalization:  Depression Medication stabilization Suicidal ideation Withdrawal symptoms  Estimated Length of Stay: 3-5 days  Attendees: Patient: 06/11/2016 3:21 PM  Physician: Dr. Parke Poisson 06/11/2016 3:21 PM  Nursing: Chrys Racer RN; [place], RN 06/11/2016 3:21 PM  RN Care Manager: Lars Pinks, RN 06/11/2016 3:21 PM  Social Worker: Matthew Saras, [place]; [place], LCSW 06/11/2016 3:21 PM  Recreational Therapist:  06/11/2016 3:21 PM  Other: Lindell Spar, NP; Samuel Jester, NP 06/11/2016 3:21 PM  Other:  06/11/2016 3:21 PM  Other: 06/11/2016 3:21 PM   Scribe for Treatment Team: Georga Kaufmann, MSW,LCSWA 06/11/2016 3:21 PM

## 2016-06-11 NOTE — H&P (Addendum)
Psychiatric Admission Assessment Adult  Patient Identification: Bradley Hanson MRN:  [id number] Date of Evaluation:  06/11/2016 Chief Complaint:  BIPOAR DISORDER OPIATE USE DISORDER CANNABIS USE DISORDER ALCOHOL USE DISORDER Principal Diagnosis: depression, anxiety, substance abuse Diagnosis:   Patient Active Problem List   Diagnosis Date Noted  . Major depressive disorder, recurrent severe without psychotic features ([place]) [F33.2] 06/10/2016  . Major depressive disorder, single episode, severe without psychosis ([place]) [F32.2] 06/10/2016  . Intentional heroin overdose ([place]) [T40.1X2A]   . Intentional drug overdose ([place]) [T50.902A] 01/14/2015  . Dental caries [K02.9] 12/22/2013  . 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 Pilonidal cyst with abscess [L05.01] 03/20/2011   History of Present Illness:  patient is seen and examined. 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 Patient stated that he has abused these substances for years and then overdosed on near 40 tablets of over the counter cold/cough medications.   Patient stated that he took 3 packs of "cough medicine along with a bottle of wine Friday."  Patient states that he started having "paranoid delusions and I was afraid.  I felt like I was getting set up to be killed."  Patient states that when he drinks, he usually "drinks a bottle of wine or 40 ounce beers."  He does not drink every day.  He stated "I used to have a terrible opiod problem and I've been in and out rehabs for the last couple of years."  Patient states he took a suboxone "a couple of weeks ago and that's why I was probably positive for opiates."  He denies any blackouts from alcohol.  He states, "I was hoping to get into [facility], however, I can't because I became close to a male that is there and I started having feelings for her."  Patient  states he is on probation for a misdemeanor larceny charge and he failed a drug test and was placed on "extended probation."  Patient denies any thoughts of self harm.  He lives with his parents in [place] and they are supportive.  He states he has been using "some type of drugs since I was 12 and staying sober no more than 60 days."  He has attended NA meetings and states, "I know what I'm supposed to do.  I need longer term treatment."  He was positive for opiods and THC, which he states he only smokes "occasionally."  Patient was sweating during admission.  His speech was rapid and pressured.  His BAL upon arrival to ED was 43.  He states that he received scripts for zoloft through [facility].  He states he is unemployed and is finding it difficult to find employment.  He complains of insomnia, depression, anxiety and hopelessness. He has a hx of seizures with the last one in 2013.  He has hx of opioid and triple C abuse.  He also has a hx of of overdose of tramdol in 2013 and was admitted to [facility].  Patient has no prior admission to [facility].  Patient has also had admissions to [facility] and [facility].  Patient was oriented to room and unit.   Associated Signs/Symptoms: Depression Symptoms:  depressed mood, fatigue, feelings of worthlessness/guilt, difficulty concentrating, hopelessness, (Hypo) Manic Symptoms:  Impulsivity, Anxiety Symptoms:  Excessive Worry, Psychotic Symptoms:  none but was psychotic and hallucinating from substances the night he was admitted PTSD Symptoms: Negative Total Time spent with  patient: 45 minutes  Past Psychiatric History: polysubstance issues for several years  Is the patient at risk to self? Yes.    Has the patient been a risk to self in the past 6 months? Yes.    Has the patient been a risk to self within the distant past? Yes.    Is the patient a risk to others? No.  Has the patient been a risk to others in the past 6 months? No.  Has the patient been a risk to  others within the distant past? No.   Prior Inpatient Therapy: patient has had out patient and in patient treatment in the past Prior Outpatient Therapy:  as per above  Alcohol Screening: 1. How often do you have a drink containing alcohol?: 2 to 4 times a month 2. How many drinks containing alcohol do you have on a typical day when you are drinking?: 5 or 6 3. How often do you have six or more drinks on one occasion?: Monthly Preliminary Score: 4 4. How often during the last year have you found that you were not able to stop drinking once you had started?: Monthly 5. How often during the last year have you failed to do what was normally expected from you becasue of drinking?: Never 6. How often during the last year have you needed a first drink in the morning to get yourself going after a heavy drinking session?: Never 7. How often during the last year have you had a feeling of guilt of remorse after drinking?: Less than monthly 8. How often during the last year have you been unable to remember what happened the night before because you had been drinking?: Less than monthly 9. Have you or someone else been injured as a result of your drinking?: No 10. Has a relative or friend or a doctor or another health worker been concerned about your drinking or suggested you cut down?: Yes, but not in the last year Alcohol Use Disorder Identification Test Final Score (AUDIT): 12 Brief Intervention: Yes Substance Abuse History in the last 12 months:  Yes.   Consequences of Substance Abuse: Family Consequences:  living with parents Blackouts:  as of the night of the overdose Withdrawal Symptoms:   Nausea Tremors Previous Psychotropic Medications: Yes  Psychological Evaluations: Yes  Past Medical History:  Past Medical History:  Diagnosis Date  . Anxiety   . Depression   . Depression   . Migraine   . Overdose    OD on Tramadol 2013 [facility]  . Pilonidal cyst   . Pilonidal cyst with abscess  03/20/2011  . Seizures ([place])    pt estimates last seizure in 2013  . Substance abuse    opoids and triple c"s    Past Surgical History:  Procedure Laterality Date  . CHOLECYSTECTOMY  2006  . INCISE AND DRAIN ABCESS     pilo cyst  . MULTIPLE EXTRACTIONS WITH ALVEOLOPLASTY N/A 12/22/2013   Procedure: EXTRACTION OF TOOTH #30 WITH ALVEOLOPLASTY AND GROSS DEBRIDEMENT OF REMAINING TEETH ;  Surgeon: Lenn Cal, DDS;  Location: [facility];  Service: Oral Surgery;  Laterality: N/A;   Family History:  Family History  Problem Relation Age of Onset  . Heart disease Paternal Grandmother    Family Psychiatric  History: substance issues in siblings Tobacco Screening: Have you used any form of tobacco in the last 30 days? (Cigarettes, Smokeless Tobacco, Cigars, and/or Pipes): No Social History:  History  Alcohol Use No  Comment: denies current use     History  Drug Use No    Additional Social History:                           Allergies:  No Known Allergies Lab Results:  Results for orders placed or performed during the hospital encounter of 06/08/16 (from the past 48 hour(s))  TSH     Status: None   Collection Time: 06/10/16  8:10 AM  Result Value Ref Range   TSH 0.699 0.350 - 4.500 uIU/mL    Comment: Performed by a 3rd Generation assay with a functional sensitivity of <=0.01 uIU/mL.  Basic metabolic panel     Status: Abnormal   Collection Time: 06/10/16  8:10 AM  Result Value Ref Range   Sodium 140 135 - 145 mmol/L   Potassium 4.6 3.5 - 5.1 mmol/L   Chloride 104 101 - 111 mmol/L   CO2 29 22 - 32 mmol/L   Glucose, Bld 116 (H) 65 - 99 mg/dL   BUN 15 6 - 20 mg/dL   Creatinine, Ser 1.07 0.61 - 1.24 mg/dL   Calcium 9.4 8.9 - 10.3 mg/dL   GFR calc non Af Amer >60 >60 mL/min   GFR calc Af Amer >60 >60 mL/min    Comment: (NOTE) The eGFR has been calculated using the CKD EPI equation. This calculation has not been validated in all clinical situations. eGFR's  persistently <60 mL/min signify possible Chronic Kidney Disease.    Anion gap 7 5 - 15    Blood Alcohol level:  Lab Results  Component Value Date   ETH 43 (H) 06/08/2016   ETH <5 91/47/8295    Metabolic Disorder Labs:  No results found for: HGBA1C, MPG No results found for: PROLACTIN No results found for: CHOL, TRIG, HDL, CHOLHDL, VLDL, LDLCALC  Current Medications: Current Facility-Administered Medications  Medication Dose Route Frequency Provider Last Rate Last Dose  . acetaminophen (TYLENOL) tablet 650 mg  650 mg Oral Q6H PRN Patrecia Pour, NP      . alum & mag hydroxide-simeth (MAALOX/MYLANTA) 200-200-20 MG/5ML suspension 30 mL  30 mL Oral Q4H PRN Patrecia Pour, NP      . hydrOXYzine (ATARAX/VISTARIL) tablet 25 mg  25 mg Oral Q6H PRN Patrecia Pour, NP   25 mg at 06/11/16 1119  . loperamide (IMODIUM) capsule 2-4 mg  2-4 mg Oral PRN Patrecia Pour, NP      . LORazepam (ATIVAN) tablet 1 mg  1 mg Oral Q6H PRN Patrecia Pour, NP   1 mg at 06/11/16 6213  . magnesium hydroxide (MILK OF MAGNESIA) suspension 30 mL  30 mL Oral Daily PRN Patrecia Pour, NP      . multivitamin with minerals tablet 1 tablet  1 tablet Oral Daily Patrecia Pour, NP   1 tablet at 06/11/16 [phone]  . ondansetron (ZOFRAN-ODT) disintegrating tablet 4 mg  4 mg Oral Q6H PRN Patrecia Pour, NP      . sertraline (ZOLOFT) tablet 100 mg  100 mg Oral Daily Sharma Covert, MD      . thiamine (VITAMIN B-1) tablet 100 mg  100 mg Oral Daily Patrecia Pour, NP   100 mg at 06/11/16 7846   PTA Medications: Prescriptions Prior to Admission  Medication Sig Dispense Refill Last Dose  . clindamycin (CLEOCIN) 150 MG capsule Take 2 capsules (300 mg total) by mouth 4 (four) times daily. (  Patient not taking: Reported on 06/08/2016) 56 capsule 0 Not Taking at Unknown time  . ibuprofen (ADVIL,MOTRIN) 800 MG tablet Take 1 tablet (800 mg total) by mouth 3 (three) times daily. (Patient not taking: Reported on 06/08/2016) 12 tablet 0 Not  Taking at Unknown time  . penicillin v potassium (VEETID) 500 MG tablet Take 1 tablet (500 mg total) by mouth 3 (three) times daily. (Patient not taking: Reported on 06/08/2016) 21 tablet 0 Not Taking at Unknown time  . sertraline (ZOLOFT) 100 MG tablet Take 100 mg by mouth daily.   06/08/2016 at Unknown time    Musculoskeletal: Strength & Muscle Tone: within normal limits Gait & Station: normal Patient leans: N/A  Psychiatric Specialty Exam: Physical Exam  ROS  Blood pressure 120/89, pulse (!) 104, temperature 97.8 F (36.6 C), temperature source Oral, resp. rate 18, height '6\' 3"'  (1.905 m), weight 113.4 kg (250 lb).Body mass index is 31.25 kg/m.  General Appearance: Casual  Eye Contact:  Fair  Speech:  Slow  Volume:  Normal  Mood:  Depressed  Affect:  Congruent  Thought Process:  Coherent  Orientation:  Full (Time, Place, and Person)  Thought Content:  Logical  Suicidal Thoughts:  No  Homicidal Thoughts:  No  Memory:  Immediate;   Fair  Judgement:  Impaired  Insight:  Lacking  Psychomotor Activity:  EPS  Concentration:  Concentration: Fair  Recall:  [place] of Knowledge:  Good  Language:  Fair  Akathisia:  No  Handed:  Right  AIMS (if indicated):     Assets:  Communication Skills Desire for Improvement Housing Social Support  ADL's:  Intact  Cognition:  WNL  Sleep:  Number of Hours: 6.75    Treatment Plan Summary: Daily contact with patient to assess and evaluate symptoms and progress in treatment, Medication management and Plan 1) patient is admitted to behavioral health and will be monitored for withdrawal syndromses as well as mood and suicidal ideation, 2) will coollect collateral information from family, 3) will arrange for treatment after the acute hospitalization is complete.  Observation Level/Precautions:  Detox 15 minute checks  Laboratory:  CBC Chemistry Profile Folic Acid UDS UA Vitamin B-12  Psychotherapy:    Medications:    Consultations:     Discharge Concerns:    Estimated LOS:  Other:     Physician Treatment Plan for Primary Diagnosis: <principal problem not specified> Long Term Goal(s): Improvement in symptoms so as ready for discharge  Short Term Goals: Ability to identify changes in lifestyle to reduce recurrence of condition will improve, Ability to verbalize feelings will improve, Ability to disclose and discuss suicidal ideas, Ability to demonstrate self-control will improve, Ability to identify and develop effective coping behaviors will improve, Ability to maintain clinical measurements within normal limits will improve, Compliance with prescribed medications will improve and Ability to identify triggers associated with substance abuse/mental health issues will improve  Physician Treatment Plan for Secondary Diagnosis: Active Problems:   Major depressive disorder, single episode, severe without psychosis ([place])  Long Term Goal(s): Improvement in symptoms so as ready for discharge  Short Term Goals: Ability to identify changes in lifestyle to reduce recurrence of condition will improve, Ability to verbalize feelings will improve, Ability to disclose and discuss suicidal ideas, Ability to demonstrate self-control will improve, Ability to identify and develop effective coping behaviors will improve, Ability to maintain clinical measurements within normal limits will improve, Compliance with prescribed medications will improve and Ability to identify triggers associated with  substance abuse/mental health issues will improve  I certify that inpatient services furnished can reasonably be expected to improve the patient's condition.    Sharma Covert, MD 3/6/20182:46 PM

## 2016-06-11 NOTE — Progress Notes (Signed)
Patient ID: Jolee EwingZachary Hanson, male   DOB: [birth year], 26 y.o.   MRN: [id number] D: Client in bed this shift, report goal: "option or what I want to do" "thinking over different places I can go to" client reports "a bit of a headache." A: Writer provides emotional support, reviewed medications, offered medications available for headache and sleep. Staff will monitor q9215min for safety. R; Client is safe on the unit. Client refused medications.

## 2016-06-11 NOTE — BHH Suicide Risk Assessment (Signed)
[facility] Admission Suicide Risk Assessment   Nursing information obtained from:  Patient Demographic factors:  Male, Caucasian, Low socioeconomic status, Unemployed Current Mental Status:  Belief that plan would result in death Loss Factors:  Legal issues, Financial problems / change in socioeconomic status Historical Factors:  Prior suicide attempts Risk Reduction Factors:  Sense of responsibility to family, Living with another person, especially a relative, Positive therapeutic relationship  Total Time spent with patient: 45 minutes Principal Problem: substance induced mood disorder Diagnosis:   Patient Active Problem List   Diagnosis Date Noted  . Major depressive disorder, recurrent severe without psychotic features (HCC) [F33.2] 06/10/2016  . Major depressive disorder, single episode, severe without psychosis (HCC) [F32.2] 06/10/2016  . Intentional heroin overdose (HCC) [T40.1X2A]   . Intentional drug overdose (HCC) [T50.902A] 01/14/2015  . Dental caries [K02.9] 12/22/2013  . 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 Pilonidal cyst with abscess [L05.01] 03/20/2011   Subjective Data: Patient is a 26 yo male IVC'd by his father after he overdosed in a suicide attempt.  Today, he continues to endorse depression with suicidal ideations.  Abuses alcohol but not regularly, dextromethophan and opiates of occasion.  No homicidal ideations or hallucinations.  No withdrawal symptoms noted.  Continued Clinical Symptoms:  Alcohol Use Disorder Identification Test Final Score (AUDIT): 12 The "Alcohol Use Disorders Identification Test", Guidelines for Use in Primary Care, Second Edition.  World Science writer [facility]). Score between 0-7:  no or low risk or alcohol related problems. Score between 8-15:  moderate risk of alcohol related problems. Score between 16-19:  high risk of alcohol related problems. Score 20 or above:  warrants further diagnostic evaluation for alcohol dependence and  treatment.   CLINICAL FACTORS:   Depression:   Anhedonia Comorbid alcohol abuse/dependence Impulsivity Alcohol/Substance Abuse/Dependencies Chronic Pain   Musculoskeletal: Strength & Muscle Tone: within normal limits Gait & Station: normal Patient leans: N/A  Psychiatric Specialty Exam: Physical Exam  Nursing note and vitals reviewed.   ROS  Blood pressure 120/89, pulse (!) 104, temperature 97.8 F (36.6 C), temperature source Oral, resp. rate 18, height 6\' 3"  (1.905 m), weight 113.4 kg (250 lb).Body mass index is 31.25 kg/m.  General Appearance: Disheveled  Eye Contact:  Fair  Speech:  Clear and Coherent  Volume:  Normal  Mood:  Depressed  Affect:  Congruent  Thought Process:  Coherent  Orientation:  Full (Time, Place, and Person)  Thought Content:  Logical  Suicidal Thoughts:  Yes.  without intent/plan  Homicidal Thoughts:  No  Memory:  Immediate;   Fair  Judgement:  Impaired  Insight:  Lacking  Psychomotor Activity:  Normal  Concentration:  Concentration: Fair  Recall:  [employer] of Knowledge:  Fair  Language:  Fair  Akathisia:  No  Handed:  Right  AIMS (if indicated):     Assets:  Communication Skills Desire for Improvement  ADL's:  Intact  Cognition:  WNL  Sleep:  Number of Hours: 6.75      COGNITIVE FEATURES THAT CONTRIBUTE TO RISK:  Closed-mindedness    SUICIDE RISK:   Mild:  Suicidal ideation of limited frequency, intensity, duration, and specificity.  There are no identifiable plans, no associated intent, mild dysphoria and related symptoms, good self-control (both objective and subjective assessment), few other risk factors, and identifiable protective factors, including available and accessible social support.  PLAN OF CARE: 1) admit to behavioral care, 2) monitor for withdrawal of substances, 3) collateral information from family, 4) arrange out patient services  for psychiatry and SA  I certify that inpatient services furnished can  reasonably be expected to improve the patient's condition.   Antonieta PertGreg Lawson Tyliek Timberman, MD 06/11/2016, 2:42 PM

## 2016-06-12 ENCOUNTER — Encounter (HOSPITAL_COMMUNITY): Payer: Self-pay | Admitting: Psychiatry

## 2016-06-12 MED ORDER — ONDANSETRON HCL 4 MG PO TABS: 4.0000 mg | ORAL_TABLET | Freq: Three times a day (TID) | ORAL | Status: DC | PRN

## 2016-06-12 MED ORDER — SUMATRIPTAN SUCCINATE 50 MG PO TABS: 50.0000 mg | ORAL_TABLET | ORAL | Status: DC | PRN

## 2016-06-12 MED ORDER — HYDROXYZINE HCL 50 MG PO TABS
50.0000 mg | ORAL_TABLET | Freq: Four times a day (QID) | ORAL | Status: AC | PRN
  Administered 2016-06-12: 50 mg via ORAL
  Filled 2016-06-12: qty 1

## 2016-06-12 NOTE — Progress Notes (Signed)
Recreation Therapy Notes  Date: 06/12/16 Time: 0930 Location: [address] Group Room  Group Topic: Stress Management  Goal Area(s) Addresses:  Patient will verbalize importance of using healthy stress management.  Patient will identify positive emotions associated with healthy stress management.   Behavioral Response: Engaged  Intervention: Stress Management  Activity :  Mindfulness Meditation.  LRT introduced the stress management technique of mindfulness meditation.  LRT played a meditation from the Calm app to allow patients to participate in mindfulness.  Patients were to follow along as the meditation played.   Education:  Stress Management, Discharge Planning.   Education Outcome: Acknowledges edcuation/In group clarification offered/Needs additional education  Clinical Observations/Feedback: Pt attended group.   Lorali Khamis, LRT/CTRS         Makaylen Thieme A 06/12/2016 11:24 AM 
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
## 2016-06-12 NOTE — Progress Notes (Signed)
Adult Psychoeducational Group Note  Date:  06/12/2016 Time:  7:03 PM  Group Topic/Focus:  Stages of Change:   The focus of this group is to explain the stages of change and help patients identify changes they want to make upon discharge.  Participation Level:  Active  Participation Quality:  Appropriate, Attentive and Sharing  Affect:  Appropriate  Cognitive:  Appropriate  Insight: Appropriate and Good  Engagement in Group:  Developing/Improving, Engaged and Supportive  Modes of Intervention:  Discussion, Education and Support  Additional Comments:  Pt attended group about personal development and discussed personal plan for change. Pt discussed what his goal is after discharger, how and why he chose that goal and what to "watch out for" as far triggers and identify them. Pt discussed support system and ways to stay on track with goal. Pt also stated his goal is seek a place of living and aftercare plans for treatment.  Karleen HampshireFox, Bradley Hanson Brittini 06/12/2016, 7:03 PM

## 2016-06-12 NOTE — B[facility] Group Notes (Signed)
BHH LCSW Group Therapy 06/12/2016 1:15 PM  Type of Therapy: Group Therapy- Emotion Regulation  Participation Level: Active   Participation Quality:  Appropriate  Affect: Appropriate  Cognitive: Alert and Oriented   Insight:  Developing/Improving  Engagement in Therapy: Developing/Improving and Engaged   Modes of Intervention: Clarification, Confrontation, Discussion, Education, Exploration, Limit-setting, Orientation, Problem-solving, Rapport Building, Dance movement psychotherapisteality Testing, Socialization and Support  Summary of Progress/Problems: The topic for group today was emotional regulation. This group focused on both positive and negative emotion identification and allowed group members to process ways to identify feelings, regulate negative emotions, and find healthy ways to manage internal/external emotions. Group members were asked to reflect on a time when their reaction to an emotion led to a negative outcome and explored how alternative responses using emotion regulation would have benefited them. Group members were also asked to discuss a time when emotion regulation was utilized when a negative emotion was experienced. Pt identified sadness as an emotion that he has a difficult time regulating. Pt explained that crying can sometimes make him feel better, although he doesn't like the stigma associated with men and crying.   Jonathon JordanLynn B Milledge Gerding, MSW, LCSWA 06/12/2016 3:23 PM

## 2016-06-12 NOTE — Progress Notes (Signed)
[facility] MD Progress Note  06/12/2016 1:14 PM Bradley Hanson  MRN:  [id number] Subjective:  Patient is a 26 YO male Principal Problem: substance abuse, depression Diagnosis:   Patient Active Problem List   Diagnosis Date Noted  . Major depressive disorder, recurrent severe without psychotic features (HCC) [F33.2] 06/10/2016  . Major depressive disorder, single episode, severe without psychosis (HCC) [F32.2] 06/10/2016  . Intentional heroin overdose (HCC) [T40.1X2A]   . Intentional drug overdose (HCC) [T50.902A] 01/14/2015  . Dental caries [K02.9] 12/22/2013  . 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 Pilonidal cyst with abscess [L05.01] 03/20/2011   Total Time spent with patient: 20 minutes  Past Psychiatric History: see H&P  Past Medical History:  Past Medical History:  Diagnosis Date  . Anxiety   . Depression   . Depression   . Migraine   . Overdose    OD on Tramadol 2013 [facility]  . Pilonidal cyst   . Pilonidal cyst with abscess 03/20/2011  . Seizures (HCC)    pt estimates last seizure in 2013  . Substance abuse    opoids and triple c"s    Past Surgical History:  Procedure Laterality Date  . CHOLECYSTECTOMY  2006  . INCISE AND DRAIN ABCESS     pilo cyst  . MULTIPLE EXTRACTIONS WITH ALVEOLOPLASTY N/A 12/22/2013   Procedure: EXTRACTION OF TOOTH #30 WITH ALVEOLOPLASTY AND GROSS DEBRIDEMENT OF REMAINING TEETH ;  Surgeon: Charlynne Pander, DDS;  Location: [facility] OR;  Service: Oral Surgery;  Laterality: N/A;   Family History:  Family History  Problem Relation Age of Onset  . Heart disease Paternal Grandmother    Family Psychiatric  History: see H&P Social History:  History  Alcohol Use No    Comment: denies current use     History  Drug Use No    Social History   Social History  . Marital status: Single    Spouse name: N/A  . Number of children: N/A  . Years of education: N/A   Social History Main Topics  . Smoking status: Former Smoker    Packs/day: 0.25    Types:  Cigarettes    Quit date: 01/18/2016  . Smokeless tobacco: Never Used  . Alcohol use No     Comment: denies current use  . Drug use: No  . Sexual activity: Not Asked   Other Topics Concern  . None   Social History Narrative  . None   Additional Social History:                         Sleep: Fair  Appetite:  Fair  Current Medications: Current Facility-Administered Medications  Medication Dose Route Frequency Provider Last Rate Last Dose  . acetaminophen (TYLENOL) tablet 650 mg  650 mg Oral Q6H PRN Charm Rings, NP      . alum & mag hydroxide-simeth (MAALOX/MYLANTA) 200-200-20 MG/5ML suspension 30 mL  30 mL Oral Q4H PRN Charm Rings, NP      . hydrOXYzine (ATARAX/VISTARIL) tablet 50 mg  50 mg Oral Q6H PRN Antonieta Pert, MD      . loperamide (IMODIUM) capsule 2-4 mg  2-4 mg Oral PRN Charm Rings, NP      . LORazepam (ATIVAN) tablet 1 mg  1 mg Oral Q6H PRN Charm Rings, NP   1 mg at 06/11/16 1452  . magnesium hydroxide (MILK OF MAGNESIA) suspension 30 mL  30 mL Oral Daily PRN Charm Rings, NP      .  multivitamin with minerals tablet 1 tablet  1 tablet Oral Daily Charm RingsJamison Y Lord, NP   1 tablet at 06/12/16 0732  . ondansetron (ZOFRAN) tablet 4 mg  4 mg Oral Q8H PRN Antonieta PertGreg Lawson Zuleyma Scharf, MD      . sertraline (ZOLOFT) tablet 100 mg  100 mg Oral Daily Antonieta PertGreg Lawson Brookelyn Gaynor, MD   100 mg at 06/12/16 0732  . SUMAtriptan (IMITREX) tablet 50 mg  50 mg Oral Q2H PRN Antonieta PertGreg Lawson Kazaria Gaertner, MD      . thiamine (VITAMIN B-1) tablet 100 mg  100 mg Oral Daily Charm RingsJamison Y Lord, NP   100 mg at 06/12/16 [id number]    Lab Results: No results found for this or any previous visit (from the past 48 hour(s)).  Blood Alcohol level:  Lab Results  Component Value Date   ETH 43 (H) 06/08/2016   ETH <5 09/09/2015    Metabolic Disorder Labs: No results found for: HGBA1C, MPG No results found for: PROLACTIN No results found for: CHOL, TRIG, HDL, CHOLHDL, VLDL, LDLCALC  Physical Findings: AIMS:  Facial and Oral Movements Muscles of Facial Expression: None, normal Lips and Perioral Area: None, normal Jaw: None, normal Tongue: None, normal,Extremity Movements Upper (arms, wrists, hands, fingers): None, normal Lower (legs, knees, ankles, toes): None, normal, Trunk Movements Neck, shoulders, hips: None, normal, Overall Severity Severity of abnormal movements (highest score from questions above): None, normal Incapacitation due to abnormal movements: None, normal Patient's awareness of abnormal movements (rate only patient's report): No Awareness, Dental Status Current problems with teeth and/or dentures?: Yes Does patient usually wear dentures?: Yes  CIWA:  CIWA-Ar Total: 7 COWS:     Musculoskeletal: Strength & Muscle Tone: within normal limits Gait & Station: normal Patient leans: N/A  Psychiatric Specialty Exam: Physical Exam  ROS  Blood pressure 123/84, pulse 95, temperature 98.7 F (37.1 C), temperature source Oral, resp. rate 20, height 6\' 3"  (1.905 m), weight 113.4 kg (250 lb).Body mass index is 31.25 kg/m.  General Appearance: Casual  Eye Contact:  Fair  Speech:  Slow  Volume:  Decreased  Mood:  Depressed  Affect:  Appropriate  Thought Process:  Coherent  Orientation:  Full (Time, Place, and Person)  Thought Content:  Logical  Suicidal Thoughts:  No  Homicidal Thoughts:  No  Memory:  Immediate;   Good  Judgement:  Fair  Insight:  Fair  Psychomotor Activity:  Increased  Concentration:  Concentration: Fair  Recall:  Fair  Fund of Knowledge:  Good  Language:  Good  Akathisia:  No  Handed:  Right  AIMS (if indicated):     Assets:  Communication Skills Desire for Improvement Vocational/Educational  ADL's:  Intact  Cognition:  WNL  Sleep:  Number of Hours: 6   Patient is seen and examined. Patient complains of continued issues with withdrawal. Most symptoms most likely secondary to opiates. We discussed changes in meds that could be made, but also stated  we would follow parameters of COWS and CIWA. Motivational techniques for better successwith substance issues. No SI  Treatment Plan Summary: Daily contact with patient to assess and evaluate symptoms and progress in treatment, Medication management and Plan 1) continue current CIWA and COWS for wothdrawal syndromes, 2) add zofran, imitrex for nausea and migraines, 3) increase hydroxyzine for anxiety, 4) collateral information from family, 5) begin discussion for discharge planning and aftercare  Antonieta PertGreg Lawson Emmery Seiler, MD 06/12/2016, 1:14 PM

## 2016-06-12 NOTE — Progress Notes (Signed)
Pt rates:  Depression 5/10 "I just started back on Zoloft". Anxiety 10/10  Sleep- fair  SI/HI- None AVH-None Withdrawal symptoms: "tremors, chills, agitation and nausea".   Pt reports decreased depression today. Pt c/o withdrawing and requested Ativan. Pt doesn't meet parameters for Ativan per order. Pt given Vistaril for anxiety. Pt observed sitting in the dayroom engaging with other pts. Pt doesn't appear to be in severe withdraw.   Medications reviewed with pt. Medications administered as ordered per MD. Verbal support provided. MD made aware of pt complaint. Pt encouraged to attend groups. 15 minute checks performed for safety.  Pt compliant with tx.

## 2016-06-12 NOTE — BHH Group Notes (Signed)
Pt did not attend the NA meeting tonight. Pt chose to stay in his room and sleep.

## 2016-06-13 MED ORDER — GABAPENTIN 300 MG PO CAPS
300.0000 mg | ORAL_CAPSULE | Freq: Three times a day (TID) | ORAL | Status: DC
  Administered 2016-06-13 – 2016-06-15 (×6): 300 mg via ORAL
  Filled 2016-06-13: qty 21
  Filled 2016-06-13 (×6): qty 1
  Filled 2016-06-13: qty 21
  Filled 2016-06-13 (×3): qty 1
  Filled 2016-06-13: qty 21
  Filled 2016-06-13: qty 1

## 2016-06-13 MED ORDER — NICOTINE POLACRILEX 2 MG MT GUM
2.0000 mg | CHEWING_GUM | OROMUCOSAL | Status: DC | PRN
  Administered 2016-06-13: 2 mg via ORAL

## 2016-06-13 MED ORDER — HYDROXYZINE HCL 25 MG PO TABS
25.0000 mg | ORAL_TABLET | Freq: Four times a day (QID) | ORAL | Status: DC | PRN
  Administered 2016-06-13 – 2016-06-14 (×2): 25 mg via ORAL
  Filled 2016-06-13: qty 1
  Filled 2016-06-13: qty 10
  Filled 2016-06-13: qty 1

## 2016-06-13 NOTE — Progress Notes (Signed)
D:Pt rates depression as a 3 and anxiety as a 6 on 0-10 scale with 10 being the most. Pt started Neurontin and is tolerating well. Pt is mildly anxious and interacting appropriately with staff and peers. A:Offered support, encouragement and 15 minute checks.  R:Pt denies si and hi. Safety maintained on the unit.

## 2016-06-13 NOTE — Plan of Care (Signed)
Problem: Safety: Goal: Ability to remain free from injury will improve Outcome: Progressing Pt remains safe on the unit tonight    

## 2016-06-13 NOTE — B[facility] Group Notes (Signed)
[facility] Group Notes:  (Nursing/MHT/Case Management/Adjunct)  Date:  06/13/2016  Time:  0845 am  Type of Therapy:  Psychoeducational Skills  Participation Level:  Active  Participation Quality:  Appropriate and Attentive  Affect:  Appropriate  Cognitive:  Alert and Appropriate  Insight:  Appropriate  Engagement in Group:  Engaged and Improving  Modes of Intervention:  Support  Summary of Progress/Problems: Patient spoke about his discharge.  He states he has a plan in place.  Patient wants to speak with MD about getting something for anxiety.  Patient was engaged during group.  Cranford MonBeaudry, Farah Lepak Evans 06/13/2016, 9:52 AM

## 2016-06-13 NOTE — Progress Notes (Signed)
Patient ID: Bradley EwingZachary Hanson, male   DOB: 05-17-1990, 26 y.o.   MRN: [id number]  Pt currently presents with a flat affect and cooperative behavior. Pt enmeshed with other patients emotions and behaviors. Exhibits lability today. Pt forwards little to staff, requests nicotine gum due to increased cravings today. Pt reports good sleep with currently.    Pt provided with medications per providers orders. Pt's labs and vitals were monitored throughout the night. Pt given a 1:1 about emotional and mental status. Pt supported and encouraged to express concerns and questions. Pt educated on medications.  Pt's safety ensured with 15 minute and environmental checks. Pt currently denies SI/HI and A/V hallucinations. Pt verbally agrees to seek staff if SI/HI or A/VH occurs and to consult with staff before acting on any harmful thoughts. Will continue POC.

## 2016-06-13 NOTE — Progress Notes (Signed)
[facility] MD Progress Note  06/13/2016 1:10 PM Bradley Hanson  MRN:  [id number] Subjective:  Patient is a 26 YO male Principal Problem: substance abuse, depression Diagnosis:   Patient Active Problem List   Diagnosis Date Noted  . Major depressive disorder, recurrent severe without psychotic features (HCC) [F33.2] 06/10/2016  . Major depressive disorder, single episode, severe without psychosis (HCC) [F32.2] 06/10/2016  . Intentional heroin overdose (HCC) [T40.1X2A]   . Intentional drug overdose (HCC) [T50.902A] 01/14/2015  . Dental caries [K02.9] 12/22/2013  . 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 Pilonidal cyst with abscess [L05.01] 03/20/2011   Total Time spent with patient: 20 minutes  Past Psychiatric History: see H&P  Past Medical History:  Past Medical History:  Diagnosis Date  . Anxiety   . Depression   . Depression   . Migraine   . Overdose    OD on Tramadol 2013 [facility]  . Pilonidal cyst   . Pilonidal cyst with abscess 03/20/2011  . Seizures (HCC)    pt estimates last seizure in 2013  . Substance abuse    opoids and triple c"s    Past Surgical History:  Procedure Laterality Date  . CHOLECYSTECTOMY  2006  . INCISE AND DRAIN ABCESS     pilo cyst  . MULTIPLE EXTRACTIONS WITH ALVEOLOPLASTY N/A 12/22/2013   Procedure: EXTRACTION OF TOOTH #30 WITH ALVEOLOPLASTY AND GROSS DEBRIDEMENT OF REMAINING TEETH ;  Surgeon: Charlynne Pander, DDS;  Location: [facility] OR;  Service: Oral Surgery;  Laterality: N/A;   Family History:  Family History  Problem Relation Age of Onset  . Heart disease Paternal Grandmother    Family Psychiatric  History: see H&P Social History:  History  Alcohol Use No    Comment: denies current use     History  Drug Use No    Social History   Social History  . Marital status: Single    Spouse name: N/A  . Number of children: N/A  . Years of education: N/A   Social History Main Topics  . Smoking status: Former Smoker    Packs/day: 0.25    Types:  Cigarettes    Quit date: 01/18/2016  . Smokeless tobacco: Never Used  . Alcohol use No     Comment: denies current use  . Drug use: No  . Sexual activity: Not Asked   Other Topics Concern  . None   Social History Narrative  . None   Additional Social History:                         Sleep: Fair  Appetite:  Fair  Current Medications: Current Facility-Administered Medications  Medication Dose Route Frequency Provider Last Rate Last Dose  . acetaminophen (TYLENOL) tablet 650 mg  650 mg Oral Q6H PRN Charm Rings, NP   650 mg at 06/13/16 0817  . alum & mag hydroxide-simeth (MAALOX/MYLANTA) 200-200-20 MG/5ML suspension 30 mL  30 mL Oral Q4H PRN Charm Rings, NP      . gabapentin (NEURONTIN) capsule 300 mg  300 mg Oral TID Antonieta Pert, MD   300 mg at 06/13/16 1149  . hydrOXYzine (ATARAX/VISTARIL) tablet 25 mg  25 mg Oral Q6H PRN Antonieta Pert, MD      . LORazepam (ATIVAN) tablet 1 mg  1 mg Oral Q6H PRN Charm Rings, NP   1 mg at 06/11/16 1452  . magnesium hydroxide (MILK OF MAGNESIA) suspension 30 mL  30 mL Oral  Daily PRN Charm RingsJamison Y Lord, NP      . multivitamin with minerals tablet 1 tablet  1 tablet Oral Daily Charm RingsJamison Y Lord, NP   1 tablet at 06/13/16 [phone]  . ondansetron (ZOFRAN) tablet 4 mg  4 mg Oral Q8H PRN Antonieta PertGreg Lawson Mordche Hedglin, MD      . sertraline (ZOLOFT) tablet 100 mg  100 mg Oral Daily Antonieta PertGreg Lawson Jenavive Lamboy, MD   100 mg at 06/13/16 [id number]  . SUMAtriptan (IMITREX) tablet 50 mg  50 mg Oral Q2H PRN Antonieta PertGreg Lawson Tita Terhaar, MD      . thiamine (VITAMIN B-1) tablet 100 mg  100 mg Oral Daily Charm RingsJamison Y Lord, NP   100 mg at 06/13/16 [id number]    Lab Results: No results found for this or any previous visit (from the past 48 hour(s)).  Blood Alcohol level:  Lab Results  Component Value Date   ETH 43 (H) 06/08/2016   ETH <5 09/09/2015    Metabolic Disorder Labs: No results found for: HGBA1C, MPG No results found for: PROLACTIN No results found for: CHOL, TRIG, HDL,  CHOLHDL, VLDL, LDLCALC  Physical Findings: AIMS: Facial and Oral Movements Muscles of Facial Expression: None, normal Lips and Perioral Area: None, normal Jaw: None, normal Tongue: None, normal,Extremity Movements Upper (arms, wrists, hands, fingers): None, normal Lower (legs, knees, ankles, toes): None, normal, Trunk Movements Neck, shoulders, hips: None, normal, Overall Severity Severity of abnormal movements (highest score from questions above): None, normal Incapacitation due to abnormal movements: None, normal Patient's awareness of abnormal movements (rate only patient's report): No Awareness, Dental Status Current problems with teeth and/or dentures?: Yes Does patient usually wear dentures?: Yes  CIWA:  CIWA-Ar Total: 4 COWS:     Musculoskeletal: Strength & Muscle Tone: within normal limits Gait & Station: normal Patient leans: N/A  Psychiatric Specialty Exam: Physical Exam  Nursing note and vitals reviewed.   ROS  Blood pressure 129/90, pulse 82, temperature 97.7 F (36.5 C), temperature source Oral, resp. rate 18, height 6\' 3"  (1.905 m), weight 113.4 kg (250 lb).Body mass index is 31.25 kg/m.  General Appearance: Casual  Eye Contact:  Fair  Speech:  Slow  Volume:  Decreased  Mood:  Depressed  Affect:  Appropriate  Thought Process:  Coherent  Orientation:  Full (Time, Place, and Person)  Thought Content:  Logical  Suicidal Thoughts:  No  Homicidal Thoughts:  No  Memory:  Immediate;   Good  Judgement:  Fair  Insight:  Fair  Psychomotor Activity:  Increased  Concentration:  Concentration: Fair  Recall:  Fair  Fund of Knowledge:  Good  Language:  Good  Akathisia:  No  Handed:  Right  AIMS (if indicated):     Assets:  Communication Skills Desire for Improvement Vocational/Educational  ADL's:  Intact  Cognition:  WNL  Sleep:  Number of Hours: 6.75   Patient is seen and examined. Patient complains of decreased issues with withdrawal. Most symptoms most  likely secondary to opiates. He is tolerating his current medication regime without difficulty. We continue to follow parameters of COWS and CIWA. Motivational techniques for better successwith substance issues. No SI  Treatment Plan Summary: Daily contact with patient to assess and evaluate symptoms and progress in treatment, Medication management and Plan 1) continue current CIWA and COWS for withdrawal syndromes, 2) continue zofran, imitrex for nausea and migraines, 3) continue hydroxyzine for anxiety, 4) collateral information from family, 5) Patient is thinking that he will return home and do out patient  treatment after discharge  Antonieta Pert, MD 06/13/2016, 1:10 PMPatient ID: Bradley Hanson, male   DOB: 1991/01/07, 26 y.o.   MRN: [id number]

## 2016-06-13 NOTE — [facility] Suicide Risk Assessment (Signed)
[facility] INPATIENT:  Family/Significant Other Suicide Prevention Education  Suicide Prevention Education:  Education Completed; Chanetta MarshallJimmy (dad [phone]), has been identified by the patient as the family member/significant other with whom the patient will be residing, and identified as the person(s) who will aid the patient in the event of a mental health crisis (suicidal ideations/suicide attempt).  With written consent from the patient, the family member/significant other has been provided the following suicide prevention education, prior to the and/or following the discharge of the patient.  The suicide prevention education provided includes the following:  Suicide risk factors  Suicide prevention and interventions  National Suicide Hotline telephone number  [facility] assessment telephone number  [facility] Assistance 911  [facility] and/or [facility] Crisis [facility] telephone number  Request made of family/significant other to:  Remove weapons (e.g., guns, rifles, knives), all items previously/currently identified as safety concern.    Remove drugs/medications (over-the-counter, prescriptions, illicit drugs), all items previously/currently identified as a safety concern.  The family member/significant other verbalizes understanding of the suicide prevention education information provided.  The family member/significant other agrees to remove the items of safety concern listed above.  Jonathon JordanLynn B Lennan Malone 06/13/2016, 2:58 PM

## 2016-06-13 NOTE — Progress Notes (Signed)
Patient ID: Jolee EwingZachary Hanson, male   DOB: [birth year], 26 y.o.   MRN: [id number]   Pt currently presents with a flat affect and depressed behavior. Pt remains in bed all night tonight, does not attend group. Pt states "I am just going through withdrawals, it will take some time" Pt reports good sleep with current medication regimen. Withdrawal symptoms include lethargy, aches/pains and nausea.   Pt provided with medications per providers orders. Pt given a 1:1 about emotional and mental status. Pt supported and encouraged to express concerns and questions. Pt educated on medications.  Pt's safety ensured with 15 minute and environmental checks. Pt currently denies SI/HI and A/V hallucinations. Pt verbally agrees to seek staff if SI/HI or A/VH occurs and to consult with staff before acting on any harmful thoughts. Will continue POC.

## 2016-06-14 MED ORDER — NICOTINE 21 MG/24HR TD PT24
21.0000 mg | MEDICATED_PATCH | Freq: Every day | TRANSDERMAL | Status: DC
  Administered 2016-06-14 – 2016-06-15 (×2): 21 mg via TRANSDERMAL
  Filled 2016-06-14 (×2): qty 1

## 2016-06-14 MED ORDER — NICOTINE 21 MG/24HR TD PT24
MEDICATED_PATCH | TRANSDERMAL | Status: AC
  Filled 2016-06-14: qty 1

## 2016-06-14 NOTE — Progress Notes (Signed)
The patient attended the evening A.A.meeting and was appropriate.  

## 2016-06-14 NOTE — Progress Notes (Signed)
Recreation Therapy Notes  Date: 06/14/16 Time: 0930 Location: [address] Dayroom  Group Topic: Stress Management  Goal Area(s) Addresses:  Patient will verbalize importance of using healthy stress management.  Patient will identify positive emotions associated with healthy stress management.   Behavioral Response: Engaged  Intervention: Stress Management  Activity :  Progressive Muscle Relaxation.  LRT introduced the stress management technique of progressive muscle relaxation.  LRT read a script to allow patients the opportunity to engage in the activity.  Patients were to follow along as the script was read.  Education:  Stress Management, Discharge Planning.   Education Outcome: Acknowledges edcuation/In group clarification offered/Needs additional education  Clinical Observations/Feedback: Pt attended group.   Caroll RancherMarjette Chrisopher Pustejovsky, LRT/CTRS         Lillia AbedLindsay, Valeria Krisko A 06/14/2016 12:12 PM

## 2016-06-14 NOTE — Progress Notes (Signed)
Data. Patient denies SI/HI/AVH. Patient interacting well with staff and other patients. Affect flat, but brightens with interaction. On his self assessment he reports 3/10 for depression and hopelessness and 6/10 for anxiety. His goal for today is, "Preparing myself mentally for what I need to do when I leave." Action. Emotional support and encouragement offered. Education provided on medication, indications and side effect. Q 15 minute checks done for safety. Response. Safety on the unit maintained through 15 minute checks.  Medications taken as prescribed. Attended groups. Remained calm and appropriate through out shift.

## 2016-06-14 NOTE — Plan of Care (Signed)
Problem: Medication: Goal: Compliance with prescribed medication regimen will improve Outcome: Progressing Patient has taken edications as ordered, this shift.

## 2016-06-14 NOTE — [facility] Group Notes (Signed)
[facility] LCSW Group Therapy 06/14/2016 1:15pm  Type of Therapy: Group Therapy- Feelings Around Relapse and Recovery  Participation Level: Active   Participation Quality:  Appropriate  Affect:  Appropriate  Cognitive: Alert and Oriented   Insight:  Developing   Engagement in Therapy: Developing/Improving and Engaged   Modes of Intervention: Clarification, Confrontation, Discussion, Education, Exploration, Limit-setting, Orientation, Problem-solving, Rapport Building, Dance movement psychotherapisteality Testing, Socialization and Support  Summary of Progress/Problems: The topic for today was feelings about relapse. The group discussed what relapse prevention is to them and identified triggers that they are on the path to relapse. Members also processed their feeling towards relapse and were able to relate to common experiences. Group also discussed coping skills that can be used for relapse prevention. Pt is feeling hopeful about discharging tomorrow. Pt plans to continue going to his outpatient appointments in order to help maintain his recovery. Pt spoke about being tired of relapsing and being ready for a life change.   Therapeutic Modalities:   Cognitive Behavioral Therapy Solution-Focused Therapy Assertiveness Training Relapse Prevention Therapy  Bradley Hanson, MSW, Theresia MajorsLCSWA [phone] 06/14/2016 3:33 PM

## 2016-06-14 NOTE — Progress Notes (Signed)
[facility] MD Progress Note  06/14/2016 12:13 PM Bradley Hanson  MRN:  [id number] Subjective:  Patient is a 26 YO male Principal Problem: substance abuse, depression Diagnosis:   Patient Active Problem List   Diagnosis Date Noted  . Major depressive disorder, recurrent severe without psychotic features (HCC) [F33.2] 06/10/2016  . Major depressive disorder, single episode, severe without psychosis (HCC) [F32.2] 06/10/2016  . Intentional heroin overdose (HCC) [T40.1X2A]   . Intentional drug overdose (HCC) [T50.902A] 01/14/2015  . Dental caries [K02.9] 12/22/2013  . 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 Pilonidal cyst with abscess [L05.01] 03/20/2011   Total Time spent with patient: 20 minutes  Past Psychiatric History: see H&P  Past Medical History:  Past Medical History:  Diagnosis Date  . Anxiety   . Depression   . Depression   . Migraine   . Overdose    OD on Tramadol 2013 [facility]  . Pilonidal cyst   . Pilonidal cyst with abscess 03/20/2011  . Seizures (HCC)    pt estimates last seizure in 2013  . Substance abuse    opoids and triple c"s    Past Surgical History:  Procedure Laterality Date  . CHOLECYSTECTOMY  2006  . INCISE AND DRAIN ABCESS     pilo cyst  . MULTIPLE EXTRACTIONS WITH ALVEOLOPLASTY N/A 12/22/2013   Procedure: EXTRACTION OF TOOTH #30 WITH ALVEOLOPLASTY AND GROSS DEBRIDEMENT OF REMAINING TEETH ;  Surgeon: Charlynne Pander, DDS;  Location: [facility] OR;  Service: Oral Surgery;  Laterality: N/A;   Family History:  Family History  Problem Relation Age of Onset  . Heart disease Paternal Grandmother    Family Psychiatric  History: see H&P Social History:  History  Alcohol Use No    Comment: denies current use     History  Drug Use No    Social History   Social History  . Marital status: Single    Spouse name: N/A  . Number of children: N/A  . Years of education: N/A   Social History Main Topics  . Smoking status: Former Smoker    Packs/day: 0.25   Types: Cigarettes    Quit date: 01/18/2016  . Smokeless tobacco: Never Used  . Alcohol use No     Comment: denies current use  . Drug use: No  . Sexual activity: Not Asked   Other Topics Concern  . None   Social History Narrative  . None   Additional Social History:                         Sleep: Fair  Appetite:  Fair  Current Medications: Current Facility-Administered Medications  Medication Dose Route Frequency Provider Last Rate Last Dose  . acetaminophen (TYLENOL) tablet 650 mg  650 mg Oral Q6H PRN Charm Rings, NP   650 mg at 06/14/16 1157  . alum & mag hydroxide-simeth (MAALOX/MYLANTA) 200-200-20 MG/5ML suspension 30 mL  30 mL Oral Q4H PRN Charm Rings, NP      . gabapentin (NEURONTIN) capsule 300 mg  300 mg Oral TID Antonieta Pert, MD   300 mg at 06/14/16 1156  . hydrOXYzine (ATARAX/VISTARIL) tablet 25 mg  25 mg Oral Q6H PRN Antonieta Pert, MD   25 mg at 06/13/16 2206  . LORazepam (ATIVAN) tablet 1 mg  1 mg Oral Q6H PRN Charm Rings, NP   1 mg at 06/11/16 1452  . magnesium hydroxide (MILK OF MAGNESIA) suspension 30 mL  30 mL  Oral Daily PRN Charm Rings, NP      . multivitamin with minerals tablet 1 tablet  1 tablet Oral Daily Charm Rings, NP   1 tablet at 06/14/16 0753  . nicotine (NICODERM CQ - dosed in mg/24 hours) 21 mg/24hr patch           . nicotine (NICODERM CQ - dosed in mg/24 hours) patch 21 mg  21 mg Transdermal Daily Craige Cotta, MD   21 mg at 06/14/16 0807  . ondansetron (ZOFRAN) tablet 4 mg  4 mg Oral Q8H PRN Antonieta Pert, MD      . sertraline (ZOLOFT) tablet 100 mg  100 mg Oral Daily Antonieta Pert, MD   100 mg at 06/14/16 0753  . SUMAtriptan (IMITREX) tablet 50 mg  50 mg Oral Q2H PRN Antonieta Pert, MD      . thiamine (VITAMIN B-1) tablet 100 mg  100 mg Oral Daily Charm Rings, NP   100 mg at 06/14/16 1610    Lab Results: No results found for this or any previous visit (from the past 48 hour(s)).  Blood Alcohol  level:  Lab Results  Component Value Date   ETH 43 (H) 06/08/2016   ETH <5 09/09/2015    Metabolic Disorder Labs: No results found for: HGBA1C, MPG No results found for: PROLACTIN No results found for: CHOL, TRIG, HDL, CHOLHDL, VLDL, LDLCALC  Physical Findings: AIMS: Facial and Oral Movements Muscles of Facial Expression: None, normal Lips and Perioral Area: None, normal Jaw: None, normal Tongue: None, normal,Extremity Movements Upper (arms, wrists, hands, fingers): None, normal Lower (legs, knees, ankles, toes): None, normal, Trunk Movements Neck, shoulders, hips: None, normal, Overall Severity Severity of abnormal movements (highest score from questions above): None, normal Incapacitation due to abnormal movements: None, normal Patient's awareness of abnormal movements (rate only patient's report): No Awareness, Dental Status Current problems with teeth and/or dentures?: Yes Does patient usually wear dentures?: Yes  CIWA:  CIWA-Ar Total: 4 COWS:     Musculoskeletal: Strength & Muscle Tone: within normal limits Gait & Station: normal Patient leans: N/A  Psychiatric Specialty Exam: Physical Exam  Nursing note and vitals reviewed.   ROS  Blood pressure (!) 138/95, pulse 79, temperature 99.3 F (37.4 C), temperature source Oral, resp. rate 18, height 6\' 3"  (1.905 m), weight 113.4 kg (250 lb).Body mass index is 31.25 kg/m.  General Appearance: Casual  Eye Contact:  Fair  Speech:  Slow  Volume:  Decreased  Mood:  Depressed  Affect:  Appropriate  Thought Process:  Coherent  Orientation:  Full (Time, Place, and Person)  Thought Content:  Logical  Suicidal Thoughts:  No  Homicidal Thoughts:  No  Memory:  Immediate;   Good  Judgement:  Fair  Insight:  Fair  Psychomotor Activity:  Increased  Concentration:  Concentration: Fair  Recall:  Fair  Fund of Knowledge:  Good  Language:  Good  Akathisia:  No  Handed:  Right  AIMS (if indicated):     Assets:   Communication Skills Desire for Improvement Vocational/Educational  ADL's:  Intact  Cognition:  WNL  Sleep:  Number of Hours: 5.5   Patient is seen and examined. Feels better today. Withdrawal symptoms improving. Mood and anxiety improving as well. BP slightly elevated but no history he is aware of. Will hold off on any meds at this point.. We continue to follow parameters of COWS and CIWA. Motivational techniques for better success with substance issues. No  Si, probable d/c tomorrow.  Treatment Plan Summary: Daily contact with patient to assess and evaluate symptoms and progress in treatment, Medication management and Plan 1) continue current CIWA and COWS for withdrawal syndromes, 2) continue zofran, imitrex for nausea and migraines, 3) continue hydroxyzine for anxiety, 4) collateral information from family, 5) Patient is thinking that he will return home and do out patient treatment after discharge, probably tomorrow  Antonieta PertGreg Lawson Shahzain Kiester, MD 06/14/2016, 12:13 PMPatient ID: Bradley Hanson, male   DOB: 18-Feb-1991, 26 y.o.   MRN: [id number] Patient ID: Bradley Hanson, male   DOB: 18-Feb-1991, 26 y.o.   MRN: [id number]

## 2016-06-15 DIAGNOSIS — Z87891 Personal history of nicotine dependence: Secondary | ICD-10-CM

## 2016-06-15 DIAGNOSIS — F322 Major depressive disorder, single episode, severe without psychotic features: Secondary | ICD-10-CM

## 2016-06-15 MED ORDER — HYDROXYZINE HCL 25 MG PO TABS: 25.0000 mg | ORAL_TABLET | Freq: Four times a day (QID) | ORAL | 0 refills | Status: DC | PRN

## 2016-06-15 MED ORDER — GABAPENTIN 300 MG PO CAPS: 300.0000 mg | ORAL_CAPSULE | Freq: Three times a day (TID) | ORAL | 0 refills | Status: AC

## 2016-06-15 MED ORDER — SERTRALINE HCL 100 MG PO TABS: 100.0000 mg | ORAL_TABLET | Freq: Every day | ORAL | 0 refills | Status: AC

## 2016-06-15 NOTE — Discharge Summary (Signed)
Physician Discharge Summary Note  Patient:  Bradley Hanson is an 26 y.o., male MRN:  [id number] DOB:  October 10, 1990 Patient phone:  [phone] (home)  Patient address:   [address] [place] [address],  Total Time spent with patient: 30 minutes  Date of Admission:  06/10/2016 Date of Discharge: 06/15/2016  Reason for Admission:Per HPI- patient is seen and examined. 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 Patient stated that he has abused these substances for years and then overdosed on near 40 tablets of over the counter cold/cough medications. Patient stated that he took 3 packs of "cough medicine along with a bottle of wine Friday." Patient states that he started having "paranoid delusions and I was afraid. I felt like I was getting set up to be killed." Patient states that when he drinks, he usually "drinks a bottle of wine or 40 ounce beers." He does not drink every day. He stated "I used to have a terrible opiod problem and I've been in and out rehabs for the last couple of years." Patient states he took a suboxone "a couple of weeks ago and that's why I was probably positive for opiates." He denies any blackouts from alcohol. He states, "I was hoping to get into [facility], however, I can't because I became close to a male that is there and I started having feelings for her." Patient states he is on probation for a misdemeanor larceny charge and he failed a drug test and was placed on "extended probation." Patient denies any thoughts of self harm. He lives with his parents in [place] and they are supportive. He states he has been using "some type of drugs since I was 12 and staying sober no more than 60 days." He has attended NA meetings and states, "I know what I'm supposed to do. I need longer term treatment." He was positive for opiods and THC,  which he states he only smokes "occasionally." Patient was sweating during admission. His speech was rapid and pressured. His BAL upon arrival to ED was 43. He states that he received scripts for zoloft through [facility]. He states he is unemployed and is finding it difficult to find employment. He complains of insomnia, depression, anxiety and hopelessness. He has a hx of seizures with the last one in 2013. He has hx of opioid and triple C abuse. He also has a hx of of overdose of tramdol in 2013 and was [facility]. Patient [facility]. Patient [facility] also [facility]. Patient was oriented to room and unit.   Principal Problem: Major depressive disorder, single episode, severe without psychosis [facility](HCC) Discharge Diagnoses: Patient Active Problem List   Diagnosis Date Noted  . Major depressive disorder, recurrent severe without psychotic features (HCC) [F33.2] 06/10/2016  . Major depressive disorder, single episode, severe without psychosis (HCC) [F32.2] 06/10/2016  . Intentional heroin overdose (HCC) [T40.1X2A]   . Intentional drug overdose (HCC) [T50.902A] 01/14/2015  . Dental caries [K02.9] 12/22/2013  . 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 Pilonidal cyst with abscess [L05.01] 03/20/2011    Past Psychiatric History:  Past Medical History:  Past Medical History:  Diagnosis Date  . Anxiety   . Depression   . Depression   . Migraine   . Overdose    OD on Tramadol 2013 [facility]  . Pilonidal cyst   .  Pilonidal cyst with abscess 03/20/2011  . Seizures (HCC)    pt estimates last seizure in 2013  . Substance abuse    opoids and triple c"s    Past Surgical History:  Procedure Laterality Date  . CHOLECYSTECTOMY  2006  . INCISE AND DRAIN ABCESS     pilo cyst  . MULTIPLE EXTRACTIONS WITH ALVEOLOPLASTY N/A 12/22/2013   Procedure: EXTRACTION OF TOOTH #30 WITH ALVEOLOPLASTY AND GROSS DEBRIDEMENT OF REMAINING TEETH ;  Surgeon:  Charlynne Pander, DDS;  Location: [facility] OR;  Service: Oral Surgery;  Laterality: N/A;   Family History:  Family History  Problem Relation Age of Onset  . Heart disease Paternal Grandmother    Family Psychiatric  History:  Social History:  History  Alcohol Use No    Comment: denies current use     History  Drug Use No    Social History   Social History  . Marital status: Single    Spouse name: N/A  . Number of children: N/A  . Years of education: N/A   Social History Main Topics  . Smoking status: Former Smoker    Packs/day: 0.25    Types: Cigarettes    Quit date: 01/18/2016  . Smokeless tobacco: Never Used  . Alcohol use No     Comment: denies current use  . Drug use: No  . Sexual activity: Not Asked   Other Topics Concern  . None   Social History Narrative  . None    Hospital Course: Bradley Hanson was admitted for Major depressive disorder, single episode, severe without psychosis (HCC)  and crisis management.  Pt was treated discharged with the medications listed below under Medication List.  Medical problems were identified and treated as needed.  Home medications were restarted as appropriate.  Improvement was monitored by observation and Bradley Hanson 's daily report of symptom reduction.  Emotional and mental status was monitored by daily self-inventory reports completed by Bradley Hanson and clinical staff.         Bradley Hanson was evaluated by the treatment team for stability and plans for continued recovery upon discharge. Bradley Hanson 's motivation was an integral factor for scheduling further treatment. Employment, transportation, bed availability, health status, family support, and any pending legal issues were also considered during hospital stay. Pt was offered further treatment options upon discharge including but not limited to Residential, Intensive Outpatient, and Outpatient treatment.  Bradley Hanson will follow up with the services as listed below under Follow Up  Information.     Upon completion of this admission the patient was both mentally and medically stable for discharge denying suicidal/homicidal ideation, auditory/visual/tactile hallucinations, delusional thoughts and paranoia.    Bradley Hanson responded well to treatment with Neurontin and  Zoloft without adverse effects. Pt demonstrated improvement without reported or observed adverse effects to the point of stability appropriate for outpatient management. Pertinent labs include: BMP, for which outpatient follow-up is necessary for lab recheck as mentioned below. Reviewed CBC, CMP, BAL, and UDS+ Opiates and THC; all unremarkable aside from noted exceptions.   Physical Findings: AIMS: Facial and Oral Movements Muscles of Facial Expression: None, normal Lips and Perioral Area: None, normal Jaw: None, normal Tongue: None, normal,Extremity Movements Upper (arms, wrists, hands, fingers): None, normal Lower (legs, knees, ankles, toes): None, normal, Trunk Movements Neck, shoulders, hips: None, normal, Overall Severity Severity of abnormal movements (highest score from questions above): None, normal Incapacitation due to abnormal movements: None, normal Patient's awareness of abnormal movements (  rate only patient's report): No Awareness, Dental Status Current problems with teeth and/or dentures?: Yes Does patient usually wear dentures?: Yes  CIWA:  CIWA-Ar Total: 4 COWS:     Musculoskeletal: Strength & Muscle Tone: within normal limits Gait & Station: normal Patient leans: N/A  Psychiatric Specialty Exam: See SRA by MD Physical Exam  Vitals reviewed. Constitutional: He is oriented to person, place, and time.  Cardiovascular: Normal rate.   Neurological: He is oriented to person, place, and time.  Psychiatric: He has a normal mood and affect. His behavior is normal.    Review of Systems  Psychiatric/Behavioral: Negative for depression (stable). The patient is not nervous/anxious (stable).      Blood pressure 124/83, pulse 70, temperature 98.1 F (36.7 C), resp. rate 18, height 6\' 3"  (1.905 m), weight 113.4 kg (250 lb).Body mass index is 31.25 kg/m.    Have you used any form of tobacco in the last 30 days? (Cigarettes, Smokeless Tobacco, Cigars, and/or Pipes): No  Has this patient used any form of tobacco in the last 30 days? (Cigarettes, Smokeless Tobacco, Cigars, and/or Pipes) Yes, No  Blood Alcohol level:  Lab Results  Component Value Date   ETH 43 (H) 06/08/2016   ETH <5 09/09/2015    Metabolic Disorder Labs:  No results found for: HGBA1C, MPG No results found for: PROLACTIN No results found for: CHOL, TRIG, HDL, CHOLHDL, VLDL, LDLCALC  See Psychiatric Specialty Exam and Suicide Risk Assessment completed by Attending Physician prior to discharge.  Discharge destination:  Home  Is patient on multiple antipsychotic therapies at discharge:  No   Has Patient had three or more failed trials of antipsychotic monotherapy by history:  No  Recommended Plan for Multiple Antipsychotic Therapies: NA  Discharge Instructions    Diet - low sodium heart healthy    Complete by:  As directed    Discharge instructions    Complete by:  As directed    Take all medications as prescribed. Keep all follow-up appointments as scheduled.  Do not consume alcohol or use illegal drugs while on prescription medications. Report any adverse effects from your medications to your primary care provider promptly.  In the event of recurrent symptoms or worsening symptoms, call 911, a crisis hotline, or go to the nearest emergency department for evaluation.   Increase activity slowly    Complete by:  As directed      Allergies as of 06/15/2016   No Known Allergies     Medication List    STOP taking these medications   clindamycin 150 MG capsule Commonly known as:  CLEOCIN   ibuprofen 800 MG tablet Commonly known as:  ADVIL,MOTRIN   penicillin v potassium 500 MG tablet Commonly  known as:  VEETID     TAKE these medications     Indication  gabapentin 300 MG capsule Commonly known as:  NEURONTIN Take 1 capsule (300 mg total) by mouth 3 (three) times daily.  Indication:  Aggressive Behavior   hydrOXYzine 25 MG tablet Commonly known as:  ATARAX/VISTARIL Take 1 tablet (25 mg total) by mouth every 6 (six) hours as needed for anxiety.  Indication:  Anxiety Neurosis   sertraline 100 MG tablet Commonly known as:  ZOLOFT Take 1 tablet (100 mg total) by mouth daily. Start taking on:  06/16/2016  Indication:  mood stablization      Follow-up Information    [facility] Follow up.   Specialty:  Professional Counselor Why:  Please  go for a walk-in appointment within 1-3 days of discharge to be established for outpatient services. Walk in hours are Mon-Fri 8:30-12:30p and 1:30p-3:30p. Please arrive as early as possible to be sure that you are seen. Contact information: [facility] [place] [address] [place] [address] [phone]           Follow-up recommendations:  Activity:  as tolerated Diet:  heart healtlhy  Comments:  Take all medications as prescribed. Keep all follow-up appointments as scheduled.  Do not consume alcohol or use illegal drugs while on prescription medications. Report any adverse effects from your medications to your primary care provider promptly.  In the event of recurrent symptoms or worsening symptoms, call 911, a crisis hotline, or go to the nearest emergency department for evaluation.   Signed: Oneta Rack, NP 06/15/2016, 9:41 AM

## 2016-06-15 NOTE — [facility] Suicide Risk Assessment (Addendum)
[facility] Dis[facility]   Principal Problem: Major depressive disorder, single episode, severe without psychosis (HCC) Discharge Diagnoses:  Patient Active Problem List   Diagnosis Date Noted  . Major depressive disorder, recurrent severe without psychotic features (HCC) [F33.2] 06/10/2016  . Major depressive disorder, single episode, severe without psychosis (HCC) [F32.2] 06/10/2016  . Intentional heroin overdose (HCC) [T40.1X2A]   . Intentional drug overdose (HCC) [T50.902A] 01/14/2015  . Dental caries [K02.9] 12/22/2013  . 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 Pilonidal cyst with abscess [L05.01] 03/20/2011    Total Time spent with patient: 20 minutes   Patient plans to go home to mom and dad and return to work.  Feels safe with himself and others. No further withdrawal symptoms. Reports positive family support.  Musculoskeletal: Strength & Muscle Tone: within normal limits Gait & Station: normal Patient leans: N/A  Psychiatric Specialty Exam: ROS  Blood pressure 124/83, pulse 70, temperature 98.1 F (36.7 C), resp. rate 18, height 6\' 3"  (1.905 m), weight 113.4 kg (250 lb).Body mass index is 31.25 kg/m.  General Appearance: Casual  Eye Contact::  Good  Speech:  Clear and Coherent409  Volume:  Normal  Mood:  Euthymic  Affect:  Congruent  Thought Process:  Goal Directed  Orientation:  Full (Time, Place, and Person)  Thought Content:  Logical  Suicidal Thoughts:  No  Homicidal Thoughts:  No  Memory:  Immediate;   Good  Judgement:  Good  Insight:  Good  Psychomotor Activity:  Normal  Concentration:  Good  Recall:  NA  Fund of Knowledge:Good  Language: Good  Akathisia:  Negative  Handed:  Right  AIMS (if indicated):     Assets:  Communication Skills Desire for Improvement Financial Resources/Insurance Housing Intimacy Leisure Time Physical Health Resilience Social Support Talents/Skills Transportation Vocational/Educational  Sleep:   Number of Hours: 5.25  Cognition: WNL  ADL's:  Intact   Mental Status Per Nursing Assessment::   On Admission:  Belief that plan would result in death  Demographic Factors:  Male, Adolescent or young adult, Caucasian and Access to firearms  Loss Factors: Financial problems/change in socioeconomic status  Historical Factors: Prior suicide attempts, Family history of mental illness or substance abuse and Impulsivity  Risk Reduction Factors:   Sense of responsibility to family, Employed, Living with another person, especially a relative and Positive social support  Continued Clinical Symptoms:  Alcohol/Substance Abuse/Dependencies More than one psychiatric diagnosis  Cognitive Features That Contribute To Risk:  None    Suicide Risk:  Mild:  Suicidal ideation of limited frequency, intensity, duration, and specificity.  There are no identifiable plans, no associated intent, mild dysphoria and related symptoms, good self-control (both objective and subjective assessment), few other risk factors, and identifiable protective factors, including available and accessible social support.  Follow-up [facility]    [facility] Follow up.   Specialty:  Professional Counselor Why:  Please go for a walk-in appointment within 1-3 days of discharge to be established for outpatient services. Walk in hours are Mon-Fri 8:30-12:30p and 1:30p-3:30p. Please arrive as early as possible to be sure that you are seen. Contact information: [facility] [address] [place] [address] [phone]           Plan Of Care/Follow-up recommendations:  Activity:  resume norma Diet:  resume normal  Burnard LeighAlexander Arya Iara Monds, MD 06/15/2016, 9:20 AM

## 2016-06-15 NOTE — Progress Notes (Signed)
D Bradley CoderZachary is readied for DC as Education administratorwriter completes dc teaching with pt. CC of dc forms given to pt , including AVS, SRA, SSP and transition form. All belongings returned to pt and pt given prescriptions for continuing meds per protocol. Pt completed his daily assessment and on it he wrote he deneid SI and he rated his depression, hopelessness and anxeity " 2/2/5", respectively. Pt escorted to bldg entrance and dc'd home stable.

## 2016-06-15 NOTE — [facility] Group Notes (Signed)
[facility] Group Notes: (Clinical Social Work)   06/15/2016      Type of Therapy:  Group Therapy   Participation Level:  Did Not Attend despite [facility] prompting - discharging   Egon Dittus Grossman-Orr, LCSW 06/15/2016, 12:10 PM     

## 2016-06-15 NOTE — Progress Notes (Signed)
  [facility] Discharge Plan :  Will you be returning to the same living situation after discharge:  Yes,  with parents At discharge, do you have transportation home?: Yes,  arranged Do you have the ability to pay for your medications: No.  Has limited resources  Release of information consent forms completed and turned in to Medical Records.  Patient to Follow up at: Follow-up [employer]    [facility] Follow up.   Specialty:  Professional Counselor Why:  Please go for a walk-in appointment within 1-3 days of discharge to be established for outpatient services. Walk in hours are Mon-Fri 8:30-12:30p and 1:30p-3:30p. Please arrive as early as possible to be sure that you are seen. Contact information: [facility] [address] [place] [address] [phone]           Next level of care provider has access to [facility] Link:no  Safety Planning and Suicide Prevention discussed: Yes,  with patient and family  Have you used any form of tobacco in the last 30 days? (Cigarettes, Smokeless Tobacco, Cigars, and/or Pipes): No  Has patient been referred to the Quitline?: N/A patient is not a smoker  Patient has been referred for addiction treatment: Pt. refused referral  Lynnell ChadMareida J Grossman-Orr 06/15/2016, 3:57 PM

## 2016-06-15 NOTE — Progress Notes (Signed)
Patient ID: Bradley EwingZachary Hanson, male   DOB: 1990-11-07, 26 y.o.   MRN: [id number]   D: Patient pleasant on approach tonight. Reports that he is suppose to be discharged on Saturday. States his mood has improved since being here and denies any withdrawals or SI. Watching TV and interacting with peer when up during the shift. No complaints A: Staff will continue to monitor on q 15 minute checks, follow treatment plan, and give medications as ordered. R: Took medication tonight to help anxiety and sleep. Compliant with the rules of the unit.

## 2016-08-20 ENCOUNTER — Encounter (HOSPITAL_BASED_OUTPATIENT_CLINIC_OR_DEPARTMENT_OTHER): Payer: Self-pay

## 2016-08-20 ENCOUNTER — Emergency Department (HOSPITAL_BASED_OUTPATIENT_CLINIC_OR_DEPARTMENT_OTHER)
Admission: EM | Admit: 2016-08-20 | Discharge: 2016-08-20 | Disposition: A | Discharge status: Home / Self Care | Payer: Self-pay | Attending: Emergency Medicine | Admitting: Emergency Medicine

## 2016-08-20 DIAGNOSIS — L0591 Pilonidal cyst without abscess: Secondary | ICD-10-CM | POA: Insufficient documentation

## 2016-08-20 DIAGNOSIS — F17210 Nicotine dependence, cigarettes, uncomplicated: Secondary | ICD-10-CM | POA: Insufficient documentation

## 2016-08-20 MED ORDER — LIDOCAINE-EPINEPHRINE (PF) 2 %-1:200000 IJ SOLN
10.0000 mL | Freq: Once | INTRAMUSCULAR | Status: AC
  Administered 2016-08-20: 10 mL
  Filled 2016-08-20: qty 10

## 2016-08-20 NOTE — ED Notes (Signed)
ED Provider at bedside. 

## 2016-08-20 NOTE — ED Triage Notes (Signed)
Pt c/o pilonidal cyst x 1 week-started draining 2 days ago-NAD-steady gait

## 2016-08-20 NOTE — ED Provider Notes (Signed)
[facility]Y [facility] Provider Note   CSN: [id number] Arrival date & time: 08/20/16  1530     History   Chief Complaint Chief Complaint  Patient presents with  . Cyst    HPI Bradley Hanson is a 26 y.o. male.  HPI Patient has history of pilonidal cysts and abscesses. Around the last week it's been more swollen and states began to drain around 2 days ago. No fevers. Pain is around 6 out of 10. No redness. Pain is worse with sitting down.   Past Medical History:  Diagnosis Date  . Anxiety   . Depression   . Depression   . Migraine   . Overdose    OD on Tramadol 2013 [facility]  . Pilonidal cyst   . Pilonidal cyst with abscess 03/20/2011  . Seizures (HCC)    pt estimates last seizure in 2013  . Substance abuse    opoids and triple c"s    Patient Active Problem List   Diagnosis Date Noted  . 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 Major depressive disorder, single episode, severe without psychosis (HCC) 06/10/2016  . Intentional heroin overdose (HCC)   . Intentional drug overdose (HCC) 01/14/2015  . Dental caries 12/22/2013  . 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 Pilonidal cyst with abscess 03/20/2011    Past Surgical History:  Procedure Laterality Date  . CHOLECYSTECTOMY  2006  . INCISE AND DRAIN ABCESS     pilo cyst  . MULTIPLE EXTRACTIONS WITH ALVEOLOPLASTY N/A 12/22/2013   Procedure: EXTRACTION OF TOOTH #30 WITH ALVEOLOPLASTY AND GROSS DEBRIDEMENT OF REMAINING TEETH ;  Surgeon: Charlynne Panderonald F Kulinski, DDS;  Location: [facility] OR;  Service: Oral Surgery;  Laterality: N/A;       Home Medications    Prior to Admission medications   Medication Sig Start Date End Date Taking? Authorizing Provider  gabapentin (NEURONTIN) 300 MG capsule Take 1 capsule (300 mg total) by mouth 3 (three) times daily. 06/15/16   Oneta RackLewis, Tanika N, NP  sertraline (ZOLOFT) 100 MG tablet Take 1 tablet (100 mg total) by mouth daily. 06/16/16   Oneta RackLewis, Tanika  N, NP    Family History Family History  Problem Relation Age of Onset  . Heart disease Paternal Grandmother     Social History Social History  Substance Use Topics  . Smoking status: Current Every Day Smoker    Packs/day: 0.25    Types: Cigarettes  . Smokeless tobacco: Never Used  . Alcohol use No     Allergies   Patient has no known allergies.   Review of Systems Review of Systems  Constitutional: Negative for appetite change, chills and fever.  Respiratory: Negative for shortness of breath.   Cardiovascular: Negative for chest pain.  Gastrointestinal: Negative for abdominal pain.  Musculoskeletal: Negative for joint swelling.  Skin: Positive for wound.  Neurological: Negative for weakness.  Psychiatric/Behavioral: Negative for confusion.     Physical Exam Updated Vital Signs BP (!) 131/99 (BP Location: Right Arm)   Pulse 80   Temp 98.6 F (37 C) (Oral)   Resp 17   Ht 6\' 2"  (1.88 m)   Wt 255 lb (115.7 kg)   SpO2 98%   BMI 32.74 kg/m   Physical Exam  Constitutional: He appears well-developed.  HENT:  Head: Atraumatic.  Abdominal: There is no tenderness.  Musculoskeletal: He exhibits no edema.  Neurological: He is alert.  Skin: Skin is warm.  Pilonidal area has scar from previous drainage. On the  left side of the scar there is a fullness. There is a draining area on the inferior aspect of the scar. Some serosanguineous drainage.     ED Treatments / Results  Labs (all labs ordered are listed, but only abnormal results are displayed) Labs Reviewed - No data to display  EKG  EKG Interpretation None       Radiology No results found.  Procedures Procedures (including critical care time)  Medications Ordered in ED Medications  lidocaine-EPINEPHrine (XYLOCAINE W/EPI) 2 %-1:200000 (PF) injection 10 mL (10 mLs Infiltration Given 08/20/16 1721)     Initial Impression / Assessment and Plan / ED Course  I have reviewed the triage vital signs  and the nursing notes.  Pertinent labs & imaging results that were available during my care of the patient were reviewed by me and considered in my medical decision making (see chart for details).    INCISION AND DRAINAGE Performed by: Billee Cashing. Consent: Verbal consent obtained. Risks and benefits: risks, benefits and alternatives were discussed Type: abscess  Body area: buttock  Anesthesia: local infiltration  Incision was made with a scalpel.  Local anesthetic: lidocaine 1% with epinephrine  Anesthetic total: 1.5 ml  Drainage: none    Patient tolerance: Patient tolerated the procedure well with no immediate complications.      Patient with pilonidal cyst. History of same. There is some fullness on it but stab incision did not show fluid. His draining inferiorly. Does not appear infected. Discharge home to follow-up with surgery.  Final Clinical Impressions(s) / ED Diagnoses   Final diagnoses:  Pilonidal cyst without abscess    New Prescriptions New Prescriptions   No medications on file     Benjiman Core, MD 08/20/16 1910

## 2017-01-23 ENCOUNTER — Emergency Department (HOSPITAL_BASED_OUTPATIENT_CLINIC_OR_DEPARTMENT_OTHER): Payer: Self-pay

## 2017-01-23 ENCOUNTER — Encounter (HOSPITAL_BASED_OUTPATIENT_CLINIC_OR_DEPARTMENT_OTHER): Payer: Self-pay | Admitting: *Deleted

## 2017-01-23 ENCOUNTER — Emergency Department (HOSPITAL_BASED_OUTPATIENT_CLINIC_OR_DEPARTMENT_OTHER)
Admission: EM | Admit: 2017-01-23 | Discharge: 2017-01-23 | Disposition: A | Discharge status: Home / Self Care | Payer: Self-pay | Attending: Emergency Medicine | Admitting: Emergency Medicine

## 2017-01-23 DIAGNOSIS — L03113 Cellulitis of right upper limb: Secondary | ICD-10-CM | POA: Insufficient documentation

## 2017-01-23 DIAGNOSIS — F17210 Nicotine dependence, cigarettes, uncomplicated: Secondary | ICD-10-CM | POA: Insufficient documentation

## 2017-01-23 DIAGNOSIS — Z79899 Other long term (current) drug therapy: Secondary | ICD-10-CM | POA: Insufficient documentation

## 2017-01-23 MED ORDER — ACETAMINOPHEN 500 MG PO TABS
1000.0000 mg | ORAL_TABLET | Freq: Once | ORAL | Status: AC
  Administered 2017-01-23: 1000 mg via ORAL
  Filled 2017-01-23: qty 2

## 2017-01-23 MED ORDER — CEPHALEXIN 500 MG PO CAPS: 500.0000 mg | ORAL_CAPSULE | Freq: Four times a day (QID) | ORAL | 0 refills | Status: AC

## 2017-01-23 MED ORDER — IBUPROFEN 600 MG PO TABS: 600.0000 mg | ORAL_TABLET | Freq: Four times a day (QID) | ORAL | 0 refills | Status: AC | PRN

## 2017-01-23 MED FILL — CEPHALEXIN 500 MG CAPSULE: 500 | 5 days supply | Qty: 20 | Fill #0

## 2017-01-23 NOTE — ED Notes (Signed)
Pt verbalizes understanding of d/c instructions and denies any further needs at this time. 

## 2017-01-23 NOTE — Discharge Instructions (Signed)
Please see the information and instructions below regarding your visit.  Your diagnoses today include:  1. Cellulitis of right upper extremity    Cellulitis is a superficial skin infection. Please take your antibiotics as prescribed for their ENTIRE prescribed duration.   Tests performed today include: See side panel of your discharge paperwork for testing performed today. Vital signs are listed at the bottom of these instructions.   Ultrasound of your elbow showed no fluid collection.  Medications prescribed:    Take any prescribed medications only as prescribed, and any over the counter medications only as directed on the packaging.  1. Please take all of your antibiotics until finished.   You may develop abdominal discomfort or nausea from the antibiotic. If this occurs, you may take it with food. Some patients also get diarrhea with antibiotics. You may help offset this with probiotics which you can buy or get in yogurt. Do not eat or take the probiotics until 2 hours after your antibiotic.  Some people develop allergies to antibiotics. Symptoms of antibiotic allergy can be mild and include a flat rash and itching. They can also be more serious and include:  ?Hives - Hives are raised, red patches of skin that are usually very itchy.  ?Lip or tongue swelling  ?Trouble swallowing or breathing  ?Blistering of the skin or mouth.  If you have any of these serious symptoms, please seek emergency medical care immediately.  Home care instructions:  Please follow any educational materials contained in this packet.    Keep affected area above the level of your heart when possible. Wash area gently twice a day with warm soapy water. Do not apply alcohol or hydrogen peroxide. Cover the area if it draining or weeping.   Follow-up instructions: Please follow-up with your primary care provider or the ED or urgent care in 48 hours for a check of the infection if symptoms are not improving.    Return instructions:  Please return to the Emergency Department if you experience worsening symptoms. Call your doctor sooner or return to the ER if you develop worsening signs of infection such as: increased redness, increased pain, pus, fever, or other symptoms that concern you.  Please return if you have any other emergent concerns.  Additional Information: Please purchase an elbow pad to wear while you're at work tonight complaining the bursa.  Your vital signs today were: BP (!) 152/109 (BP Location: Left Arm)    Pulse 96    Temp 98.5 F (36.9 C) (Oral)    Resp 20    Ht 6\' 3"  (1.905 m)    Wt 115.7 kg (255 lb)    SpO2 100%    BMI 31.87 kg/m  If your blood pressure (BP) was elevated on multiple readings during this visit above 130 for the top number or above 80 for the bottom number, please have this repeated by your primary care provider within one month. --------------  Thank you for allowing us to participate in your care today. It was a pleasure taking care of you today!

## 2017-01-23 NOTE — ED Triage Notes (Signed)
Pain, swelling and redness to his right elbow since last night.

## 2017-01-23 NOTE — ED Provider Notes (Signed)
[facility] EMERGENCY DEPARTMENT Provider Note   CSN: [id number] Arrival date & time: 01/23/17  1345     History   Chief Complaint Chief Complaint  Patient presents with  . Elbow Pain    HPI Bradley Hanson is a 26 y.o. male.  HPI   Patient is a 26 year old male with a history of anxiety, depression, and substance use presenting for acute pain and swelling of the right elbow. Patient reports that he was at work early this morning around 2-3 AM when he noticed the pain. Patient reports that the pain and swelling significantly increased throughout the day. No associated trauma to the elbow or break in skin. Patient reports that he attempted ice and ibuprofen without improvement in the swelling. Patient denies fever, chills, inability to flex or extend elbow, numbness or paresthesias in distal right upper extremity, or muscular weakness and right upper extremity. Patient does not have a history of diabetes or any other condition that is immunosuppressing. Patient denies any sexual activity, penile discharge, dysuria, or history of chlamydia or gonorrhea infection. Patient denies IVDU.  Past Medical History:  Diagnosis Date  . Anxiety   . Depression   . Depression   . Migraine   . Overdose    OD on Tramadol 2013 [facility]  . Pilonidal cyst   . Pilonidal cyst with abscess 03/20/2011  . Seizures (HCC)    pt estimates last seizure in 2013  . 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 Major depressive disorder, single episode, severe without psychosis (HCC) 06/10/2016  . Intentional heroin overdose (HCC)   . Intentional drug overdose (HCC) 01/14/2015  . Dental caries 12/22/2013  . 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 Pilonidal cyst with abscess 03/20/2011    Past Surgical History:  Procedure Laterality Date  .  CHOLECYSTECTOMY  2006  . INCISE AND DRAIN ABCESS     pilo cyst  . MULTIPLE EXTRACTIONS WITH ALVEOLOPLASTY N/A 12/22/2013   Procedure: EXTRACTION OF TOOTH #30 WITH ALVEOLOPLASTY AND GROSS DEBRIDEMENT OF REMAINING TEETH ;  Surgeon: Charlynne Panderonald F Kulinski, DDS;  Location: [facility] OR;  Service: Oral Surgery;  Laterality: N/A;       Home Medications    Prior to Admission medications   Medication Sig Start Date End Date Taking? Authorizing Provider  sertraline (ZOLOFT) 100 MG tablet Take 1 tablet (100 mg total) by mouth daily. 06/16/16  Yes Oneta RackLewis, Tanika N, NP  gabapentin (NEURONTIN) 300 MG capsule Take 1 capsule (300 mg total) by mouth 3 (three) times daily. 06/15/16   Oneta RackLewis, Tanika N, NP    Family History Family History  Problem Relation Age of Onset  . Heart disease Paternal Grandmother     Social History Social History  Substance Use Topics  . Smoking status: Current Every Day Smoker    Packs/day: 0.25    Types: Cigarettes  . Smokeless tobacco: Never Used  . Alcohol use No     Allergies   Patient has no known allergies.   Review of Systems Review of Systems  Constitutional: Negative for chills and fever.  Gastrointestinal: Negative for abdominal pain, nausea and vomiting.  Musculoskeletal: Positive for arthralgias and joint swelling.  Skin: Positive for color change.  Neurological: Negative for weakness and numbness.     Physical Exam Updated Vital Signs BP (!) 152/109 (BP Location: Left Arm)  Pulse 96   Temp 98.5 F (36.9 C) (Oral)   Resp 20   Ht 6\' 3"  (1.905 m)   Wt 115.7 kg (255 lb)   SpO2 100%   BMI 31.87 kg/m   Physical Exam  Constitutional: He appears well-developed and well-nourished. No distress.  Sitting comfortably in bed.  HENT:  Head: Normocephalic and atraumatic.  Eyes: Conjunctivae are normal. Right eye exhibits no discharge. Left eye exhibits no discharge.  EOMs normal to gross examination.  Neck: Normal range of motion.  Cardiovascular: Normal  rate and regular rhythm.   Intact, 2+ radial pulse in right upper extremity.  Pulmonary/Chest:  Normal respiratory effort. Patient converses comfortably. No audible wheeze or stridor.  Abdominal: He exhibits no distension.  Musculoskeletal:  Moderate swelling and erythema around the olecranon of right elbow. No fluctuance or induration. Patient able to fully and actively flex right upper trauma to elbow. Patient able to extend right elbow to approximately 160 degrees.   Neurological: He is alert.  SENSORY: Sensation is intact to light touch in right:  Superficial radial nerve distribution (dorsal first web space) Median nerve distribution (tip of index finger)   Ulnar nerve distribution (tip of small finger)   Skin: Skin is warm and dry. He is not diaphoretic. There is erythema.  Psychiatric: He has a normal mood and affect. His behavior is normal. Judgment and thought content normal.  Nursing note and vitals reviewed.    ED Treatments / Results  Labs (all labs ordered are listed, but only abnormal results are displayed) Labs Reviewed - No data to display  EKG  EKG Interpretation None       Radiology Dg Elbow Complete Right  Result Date: 01/23/2017 CLINICAL DATA:  Two days of swelling and pain over the anterior aspect of the elbow. No known injury. EXAM: RIGHT ELBOW - COMPLETE 3+ VIEW COMPARISON:  None in PACs FINDINGS: The bones are subjectively adequately mineralized. There is no acute or healing fracture. There is no joint effusion. There is a small olecranon spur with overlying soft tissue swelling. No significant degenerative changes are observed elsewhere. IMPRESSION: There is no acute bony abnormality of the left elbow. There is a tiny olecranon spur with mild overlying soft tissue swelling which may reflect olecranon bursitis. Electronically Signed   By: David  [place] M.D.   On: 01/23/2017 14:02    Procedures Procedures (including critical care time)  Medications  Ordered in ED Medications - No data to display   Initial Impression / Assessment and Plan / ED Course  I have reviewed the triage vital signs and the nursing notes.  Pertinent labs & imaging results that were available during my care of the patient were reviewed by me and considered in my medical decision making (see chart for details).    Final Clinical Impressions(s) / ED Diagnoses   Final diagnoses:  None   Differential diagnosis includes septic bursitis, cellulitis, septic arthritis, gonococcal arthritis. Ultrasound of the right elbow demonstrates no fluid collection in the bursa.Patient able to flex and extend elbow, and there is no involvement of the antecubital fossa. Therefore, doubt septic arthritis. Patient is neurovascularly intact in the right extremity. This is likely cellulitic. Patient will be discharged with Keflex and ibuprofen. Patient given return precautions for any increasing swelling, purulent drainage, or numbness, paresthesias or weakness down the right upper extremity. Patient is understanding and agrees with the plan of care.  This is a shared visit with Theda Belfast. Patient was independently evaluated by  this attending physician. Attending physician consulted in evaluation and discharge management.  New Prescriptions New Prescriptions   No medications on file      Delia Chimes 01/23/17 1906    Elisha Ponder, PA-C 01/27/17 1745    Tegeler, Canary Brim, MD 01/28/17 1600

## 2017-03-08 ENCOUNTER — Encounter (HOSPITAL_BASED_OUTPATIENT_CLINIC_OR_DEPARTMENT_OTHER): Payer: Self-pay | Admitting: Emergency Medicine

## 2017-03-08 ENCOUNTER — Emergency Department (HOSPITAL_BASED_OUTPATIENT_CLINIC_OR_DEPARTMENT_OTHER)
Admission: EM | Admit: 2017-03-08 | Discharge: 2017-03-08 | Disposition: A | Discharge status: Home / Self Care | Payer: Self-pay | Attending: Emergency Medicine | Admitting: Emergency Medicine

## 2017-03-08 ENCOUNTER — Other Ambulatory Visit: Payer: Self-pay

## 2017-03-08 DIAGNOSIS — L0591 Pilonidal cyst without abscess: Secondary | ICD-10-CM | POA: Insufficient documentation

## 2017-03-08 DIAGNOSIS — F17210 Nicotine dependence, cigarettes, uncomplicated: Secondary | ICD-10-CM | POA: Insufficient documentation

## 2017-03-08 DIAGNOSIS — Z79899 Other long term (current) drug therapy: Secondary | ICD-10-CM | POA: Insufficient documentation

## 2017-03-08 MED ORDER — DOXYCYCLINE HYCLATE 100 MG PO CAPS: 100.0000 mg | ORAL_CAPSULE | Freq: Two times a day (BID) | ORAL | 0 refills | Status: AC

## 2017-03-08 NOTE — ED Provider Notes (Signed)
[facility] EMERGENCY DEPARTMENT Provider Note   CSN: [id number] Arrival date & time: 03/08/17  4098     History   Chief Complaint Chief Complaint  Patient presents with  . Abscess    HPI Bradley Hanson is a 26 y.o. male with history of pilonidal cyst who presents with a 4-day history of tightness over his pilonidal cyst.  He denies any severe pain, but feels like his spine and abdomen are tight.  Denies any fevers, drainage, redness, swelling to the area.  He has not used any medications at home.  He has not seen general surgery, as he does not have insurance at this time.  He denies any numbness or tingling to his legs, saddle anesthesia, bowel or bladder incontinence.  HPI  Past Medical History:  Diagnosis Date  . Anxiety   . Depression   . Depression   . Migraine   . Overdose    OD on Tramadol 2013 [facility]  . Pilonidal cyst   . Pilonidal cyst with abscess 03/20/2011  . Seizures (HCC)    pt estimates last seizure in 2013  . 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 Major depressive disorder, single episode, severe without psychosis (HCC) 06/10/2016  . Intentional heroin overdose (HCC)   . Intentional drug overdose (HCC) 01/14/2015  . Dental caries 12/22/2013  . 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 Pilonidal cyst with abscess 03/20/2011    Past Surgical History:  Procedure Laterality Date  . CHOLECYSTECTOMY  2006  . INCISE AND DRAIN ABCESS     pilo cyst  . MULTIPLE EXTRACTIONS WITH ALVEOLOPLASTY N/A 12/22/2013   Procedure: EXTRACTION OF TOOTH #30 WITH ALVEOLOPLASTY AND GROSS DEBRIDEMENT OF REMAINING TEETH ;  Surgeon: Charlynne Pander, DDS;  Location: [facility] OR;  Service: Oral Surgery;  Laterality: N/A;       Home Medications    Prior to Admission medications   Medication Sig Start Date End Date Taking?  Authorizing Provider  sertraline (ZOLOFT) 100 MG tablet Take 1 tablet (100 mg total) by mouth daily. 06/16/16  Yes Oneta Rack, NP  cephALEXin (KEFLEX) 500 MG capsule Take 1 capsule (500 mg total) by mouth 4 (four) times daily. 01/23/17   Aviva Kluver B, PA-C  doxycycline (VIBRAMYCIN) 100 MG capsule Take 1 capsule (100 mg total) by mouth 2 (two) times daily. 03/08/17   Yomar Mejorado, Waylan Boga, PA-C  gabapentin (NEURONTIN) 300 MG capsule Take 1 capsule (300 mg total) by mouth 3 (three) times daily. 06/15/16   Oneta Rack, NP  ibuprofen (ADVIL,MOTRIN) 600 MG tablet Take 1 tablet (600 mg total) by mouth every 6 (six) hours as needed. 01/23/17   Elisha Ponder, PA-C    Family History Family History  Problem Relation Age of Onset  . Heart disease Paternal Grandmother     Social History Social History   Tobacco Use  . Smoking status: Current Every Day Smoker    Packs/day: 0.25    Types: Cigarettes  . Smokeless tobacco: Never Used  Substance Use Topics  . Alcohol use: No  . Drug use: No     Allergies   Patient has no known allergies.   Review of Systems Review of Systems  Constitutional: Negative for chills and fever.  HENT: Negative for facial swelling and sore throat.   Respiratory: Negative for shortness  of breath.   Cardiovascular: Negative for chest pain.  Gastrointestinal: Negative for abdominal pain, nausea and vomiting.  Genitourinary: Negative for dysuria.  Musculoskeletal: Negative for back pain.  Skin: Negative for rash and wound.  Neurological: Negative for headaches.  Psychiatric/Behavioral: The patient is not nervous/anxious.      Physical Exam Updated Vital Signs BP (!) 155/93 (BP Location: Left Arm)   Pulse (!) 102   Temp 98.3 F (36.8 C) (Oral)   Resp 18   Ht 6\' 3"  (1.905 m)   Wt 117.9 kg (260 lb)   SpO2 98%   BMI 32.50 kg/m   Physical Exam  Constitutional: He appears well-developed and well-nourished. No distress.  HENT:  Head: Normocephalic  and atraumatic.  Mouth/Throat: Oropharynx is clear and moist. No oropharyngeal exudate.  Eyes: Conjunctivae are normal. Pupils are equal, round, and reactive to light. Right eye exhibits no discharge. Left eye exhibits no discharge. No scleral icterus.  Neck: Normal range of motion. Neck supple. No thyromegaly present.  Cardiovascular: Regular rhythm, normal heart sounds and intact distal pulses. Exam reveals no gallop and no friction rub.  No murmur heard. Pulmonary/Chest: Effort normal and breath sounds normal. No stridor. No respiratory distress. He has no wheezes. He has no rales.  Abdominal: Soft. Bowel sounds are normal. He exhibits no distension. There is no tenderness. There is no rebound and no guarding.  "Tightness" on palpation of the lower abdomen  Musculoskeletal: He exhibits no edema.  5/5 strength and normal sensation, 2+ patellar reflexes  Lymphadenopathy:    He has no cervical adenopathy.  Neurological: He is alert. Coordination normal.  Skin: Skin is warm and dry. No rash noted. He is not diaphoretic. No pallor.  Scar tissue noted at the superior gluteal cleft, no fluctuance, erythema, or tenderness; no significant fluid collection or abscess noted on bedside ultrasound  Psychiatric: He has a normal mood and affect.  Nursing note and vitals reviewed.    ED Treatments / Results  Labs (all labs ordered are listed, but only abnormal results are displayed) Labs Reviewed - No data to display  EKG  EKG Interpretation None       Radiology No results found.  Procedures Procedures (including critical care time)  Medications Ordered in ED Medications - No data to display   Initial Impression / Assessment and Plan / ED Course  I have reviewed the triage vital signs and the nursing notes.  Pertinent labs & imaging results that were available during my care of the patient were reviewed by me and considered in my medical decision making (see chart for details).       Patient has no signs of infection or abscess at this time.  No erythema, drainage, edema, or fluctuance indicating incision and drainage at this time.  Patient offered doxycycline to cover for a possible early infection.  However, patient strongly encouraged to follow-up with general surgery for further evaluation and treatment of his pilonidal cyst.  Strict return precautions given.  Patient understands and agrees with plan.  Patient vitals stable and discharged in satisfactory condition.  Patient also evaluated by Dr. Denton LankSteinl who guided the patient's management and agrees with plan.  Final Clinical Impressions(s) / ED Diagnoses   Final diagnoses:  Pilonidal cyst without infection    ED Discharge Orders        Ordered    doxycycline (VIBRAMYCIN) 100 MG capsule  2 times daily     03/08/17 1027       Josean Lycan,  Waylan Bogalexandra M, PA-C 03/08/17 1037    Cathren LaineSteinl, Kevin, MD 03/08/17 1220

## 2017-03-08 NOTE — ED Triage Notes (Signed)
"   I have a abscess on my butt for years that has had to cut on and I think it needs it again"

## 2017-03-08 NOTE — Discharge Instructions (Signed)
Take doxycycline until completed.  Please see general surgery for further evaluation and treatment of your symptoms.  Please return to the emergency department if you develop any new or worsening symptoms including increasing pain, redness, swelling, drainage, or any other new or concerning symptoms.

## 2018-01-13 IMAGING — DX DG ELBOW COMPLETE 3+V*R*
4 series · 4 of 4 positions shown · non-contrast
Comparison: None in PACs

CLINICAL DATA: Two days of swelling and pain over the anterior
aspect of the elbow. No known injury.

EXAM:
RIGHT ELBOW - COMPLETE 3+ VIEW

[elbow ap]
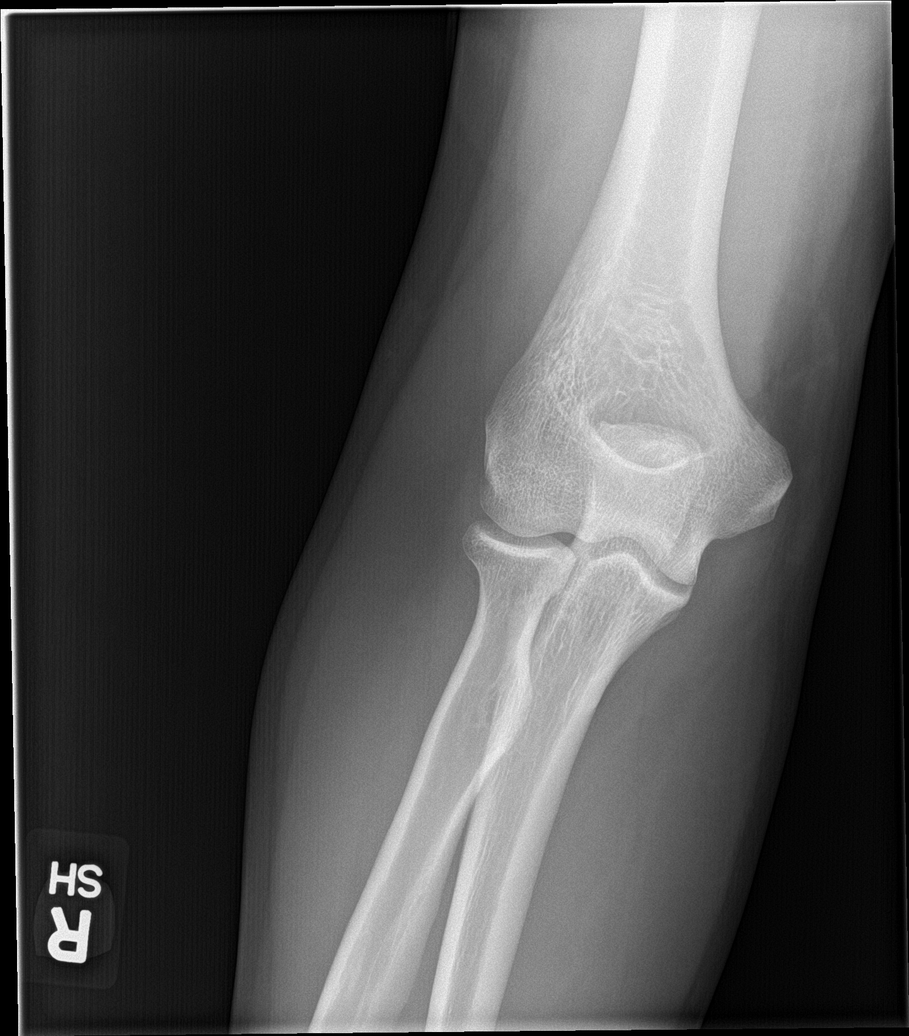

[elbow obl (1 of 2)]
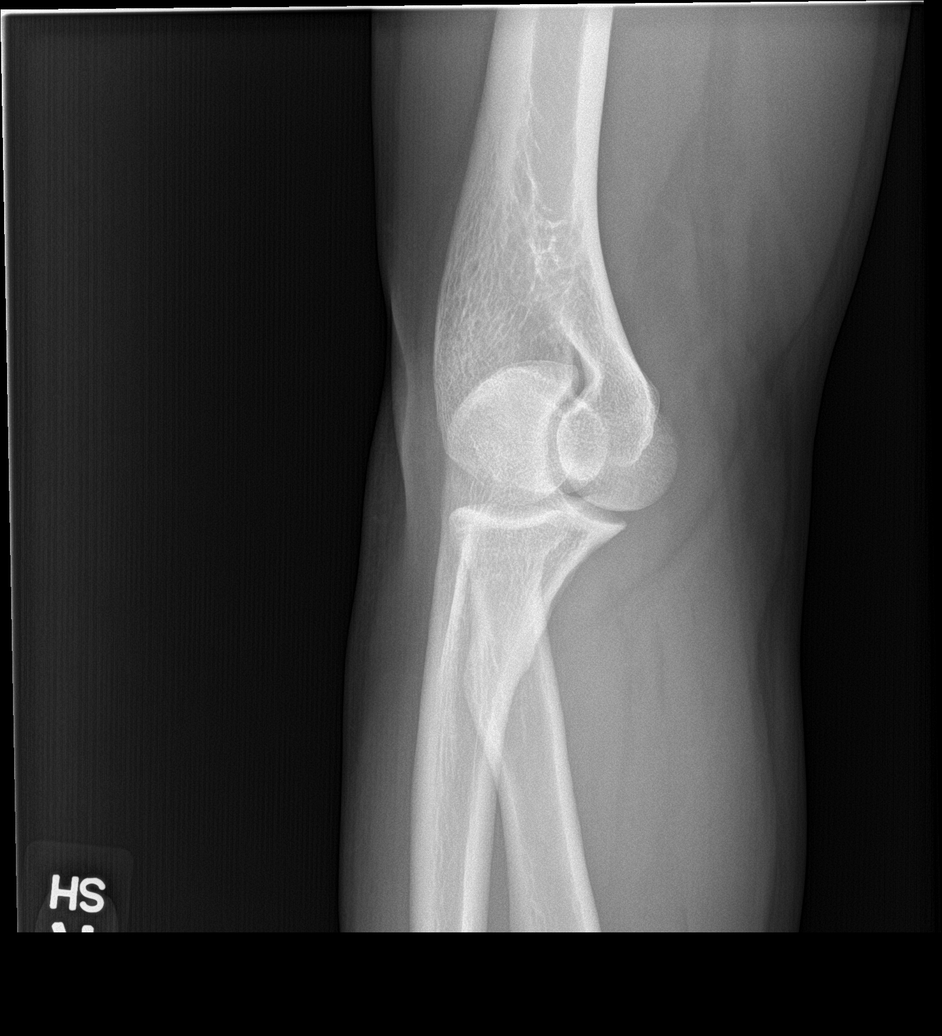

[elbow obl (2 of 2)]
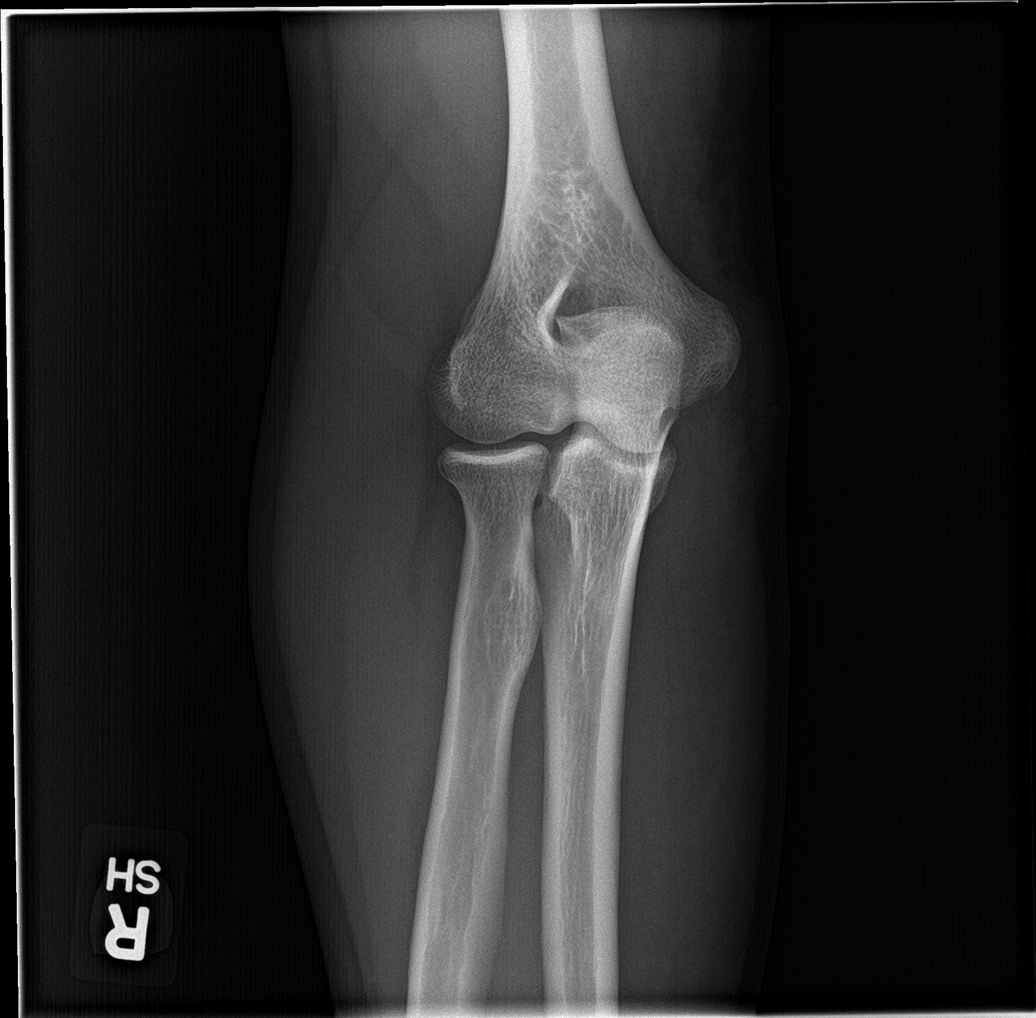

[elbow lat]
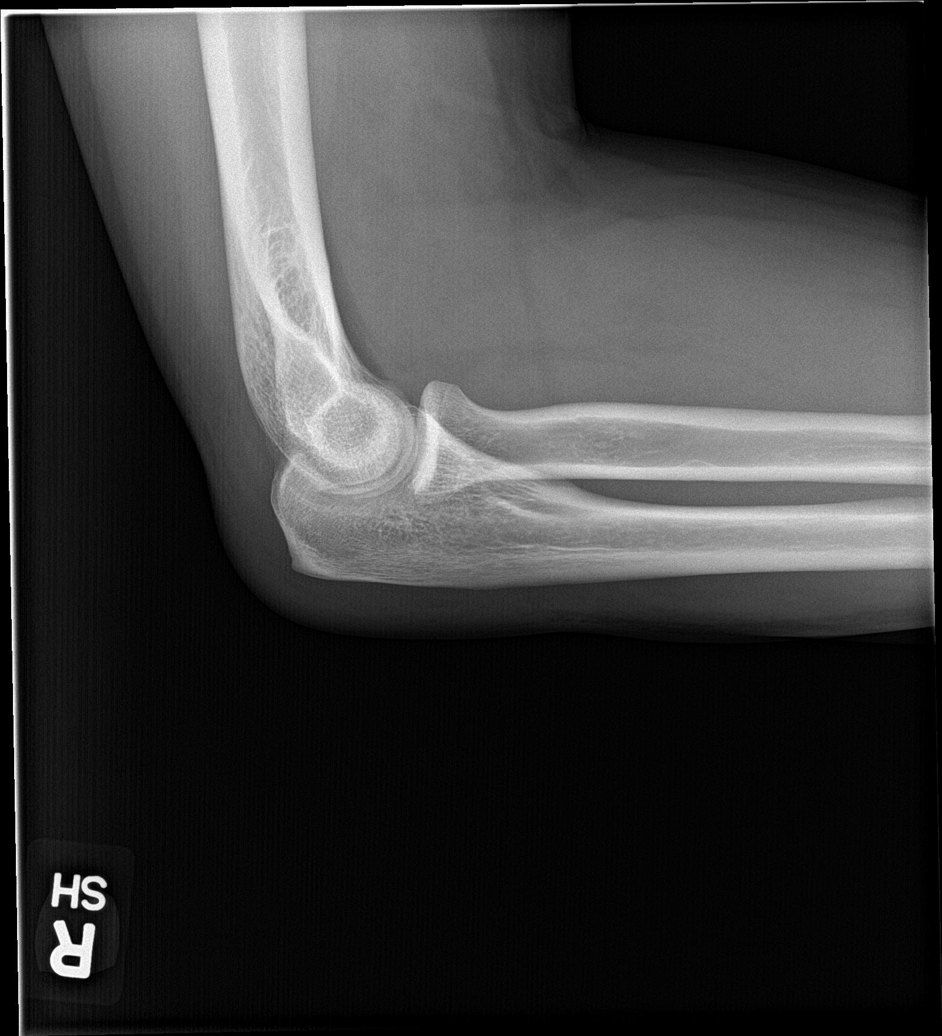

[4 of 4 positions shown; findings below may reference images not displayed]

FINDINGS: The bones are subjectively adequately mineralized. There is no acute
or healing fracture. There is no joint effusion. There is a small
olecranon spur with overlying soft tissue swelling. No significant
degenerative changes are observed elsewhere.
IMPRESSION: There is no acute bony abnormality of the left elbow. There is a
tiny olecranon spur with mild overlying soft tissue swelling which
may reflect olecranon bursitis.

## 2018-11-24 ENCOUNTER — Ambulatory Visit (HOSPITAL_COMMUNITY)
Admission: RE | Admit: 2018-11-24 | Discharge: 2018-11-24 | Disposition: A | Discharge status: Home / Self Care | Payer: Self-pay | Attending: Psychiatry | Admitting: Psychiatry

## 2018-11-24 ENCOUNTER — Encounter (HOSPITAL_COMMUNITY): Payer: Self-pay | Admitting: Clinical

## 2018-11-24 DIAGNOSIS — F1320 Sedative, hypnotic or anxiolytic dependence, uncomplicated: Secondary | ICD-10-CM | POA: Insufficient documentation

## 2018-11-24 DIAGNOSIS — F411 Generalized anxiety disorder: Secondary | ICD-10-CM | POA: Insufficient documentation

## 2018-11-24 DIAGNOSIS — F17210 Nicotine dependence, cigarettes, uncomplicated: Secondary | ICD-10-CM | POA: Insufficient documentation

## 2018-11-24 DIAGNOSIS — F332 Major depressive disorder, recurrent severe without psychotic features: Secondary | ICD-10-CM | POA: Insufficient documentation

## 2018-11-24 NOTE — H&P (Signed)
Behavioral Health Medical Screening Exam  Bradley Hanson is an 28 y.o. male requesting detox from cough medicine.  He is presenting voluntarily to [facility] requesting detox. Patient reports using 2-3 16 packs of Coricidin Cough and Cold daily. Patient also has a history of opiate addiction. He denies SI/HI/AVH. Patient reports that he is diagnosed with MDD and GAD. He sees Dr. Loni Muse at [facility] for medication management. He states that he wants to get into residential treatment at [facility], however needs to detox first. Patient provided with resources for detox facilities.  Total Time spent with patient: 30 minutes  Psychiatric Specialty Exam: Physical Exam  ROS  Blood pressure (!) 155/105, pulse 75, temperature 98.6 F (37 C), temperature source Oral, resp. rate 18, SpO2 98 %.There is no height or weight on file to calculate BMI.  General Appearance: Fairly Groomed  Eye Contact:  Fair  Speech:  Clear and Coherent and Normal Rate  Volume:  Normal  Mood:  Anxious  Affect:  Congruent  Thought Process:  Coherent, Linear and Descriptions of Associations: Intact  Orientation:  Full (Time, Place, and Person)  Thought Content:  Logical  Suicidal Thoughts:  No  Homicidal Thoughts:  No  Memory:  Immediate;   Fair Recent;   Fair  Judgement:  Intact  Insight:  Fair  Psychomotor Activity:  Normal  Concentration: Concentration: Fair and Attention Span: Fair  Recall:  [employer] of Knowledge:Fair  Language: Fair  Akathisia:  No  Handed:  Right  AIMS (if indicated):     Assets:  Communication Skills Desire for Improvement Financial Resources/Insurance Housing Leisure Time [name]  Sleep:       Musculoskeletal: Strength & Muscle Tone: within normal limits Gait & Station: normal Patient leans: N/A  Blood pressure (!) 155/105, pulse 75, temperature 98.6 F (37 C), temperature source Oral, resp. rate 18, SpO2 98 %.  Recommendations:  Based on my  evaluation the patient does not appear to have an emergency medical condition. Will refer to [facility] for acute detox, and then follow up with outpatient resources for addiction therapy.   Suella Broad, FNP 11/24/2018, 2:09 PM

## 2018-11-24 NOTE — BH Assessment (Signed)
Assessment Note  Bradley Hanson is an 28 y.o. male presenting voluntarily to [facility] requesting detox. Patient reports using 2-3 16 packs of Coricidin Cough and Cold daily. Patient also has a history of opiate addiction. He denies SI/HI/AVH. Patient reports that he is diagnosed with MDD and GAD. He sees Dr. Loni Muse at [facility] for medication management. He states that he wants to get into residential treatment at [facility], however needs to detox first. Patient provided with resources for detox facilities.   Diagnosis: F33.2 MDD, recurrent, severe (per history)   F41.1 GAD (per history)   F13.20 Sedative, hypnotic or anxiolytic use disorder, severe  Past Medical History:  Past Medical History:  Diagnosis Date  . Anxiety   . Depression   . Depression   . Migraine   . Overdose    OD on Tramadol 2013 [facility]  . Pilonidal cyst   . Pilonidal cyst with abscess 03/20/2011  . Seizures ([place])    pt estimates last seizure in 2013  . Substance abuse ([place])    opoids and triple c"s    Past Surgical History:  Procedure Laterality Date  . CHOLECYSTECTOMY  2006  . INCISE AND DRAIN ABCESS     pilo cyst  . MULTIPLE EXTRACTIONS WITH ALVEOLOPLASTY N/A 12/22/2013   Procedure: EXTRACTION OF TOOTH #30 WITH ALVEOLOPLASTY AND GROSS DEBRIDEMENT OF REMAINING TEETH ;  Surgeon: Lenn Cal, DDS;  Location: [facility];  Service: Oral Surgery;  Laterality: N/A;    Family History:  Family History  Problem Relation Age of Onset  . Heart disease Paternal Grandmother     Social History:  reports that he has been smoking cigarettes. He has been smoking about 0.25 packs per day. He has never used smokeless tobacco. He reports that he does not drink alcohol or use drugs.  Additional Social History:  Alcohol / Drug Use Pain Medications: see MAR Prescriptions: see MAR Over the Counter: see MAR History of alcohol / drug use?: Yes Substance #1 Name of Substance 1: Coricidin Cough and Cold 1 - Age of First Use: 26 1 - Amount  (size/oz): 2 or 3 packs of 16 tablets 1 - Frequency: daily 1 - Duration: "a couple years" 1 - Last Use / Amount: 11/23/2018  CIWA: CIWA-Ar BP: (!) 155/105 Pulse Rate: 75 COWS:    Allergies: No Known Allergies  Home Medications: (Not in a hospital admission)   OB/GYN Status:  No LMP for male patient.  General Assessment Data Location of Assessment: [facility] Assessment Services TTS Assessment: In system Is this a Tele or Face-to-Face Assessment?: Face-to-Face Is this an Initial Assessment or a Re-assessment for this encounter?: Initial Assessment Patient Accompanied by:: N/A Language Other than English: No Living Arrangements: (with parents) What gender do you identify as?: Male Marital status: Single Maiden name: Dowd Pregnancy Status: No Living Arrangements: Parent Can pt return to current living arrangement?: Yes Admission Status: Voluntary Is patient capable of signing voluntary admission?: Yes Referral Source: Self/Family/Friend Insurance type: None     Crisis Care Plan Living Arrangements: Parent Legal Guardian: (self) Name of Psychiatrist: Dr. Loni Muse at [facility] Name of Therapist: none  Education Status Is patient currently in school?: No Is the patient employed, unemployed or receiving disability?: Unemployed  Risk to self with the past 6 months Suicidal Ideation: No Has patient been a risk to self within the past 6 months prior to admission? : No Suicidal Intent: No Has patient had any suicidal intent within the past 6 months prior to admission? :  No Is patient at risk for suicide?: No Suicidal Plan?: No Has patient had any suicidal plan within the past 6 months prior to admission? : No Access to Means: No What has been your use of drugs/alcohol within the last 12 months?: cold medicine, occasional alcohol Previous Attempts/Gestures: Yes How many times?: 1 Other Self Harm Risks: none noted Triggers for Past Attempts: None known Intentional Self Injurious Behavior:  None Family Suicide History: No Recent stressful life event(s): (none noted) Persecutory voices/beliefs?: No Depression: Yes Depression Symptoms: Despondent, Tearfulness, Isolating, Guilt, Loss of interest in usual pleasures, Feeling worthless/self pity, Feeling angry/irritable Substance abuse history and/or treatment for substance abuse?: Yes Suicide prevention information given to non-admitted patients: Not applicable  Risk to Others within the past 6 months Homicidal Ideation: No Does patient have any lifetime risk of violence toward others beyond the six months prior to admission? : No Thoughts of Harm to Others: No Current Homicidal Intent: No Current Homicidal Plan: No Access to Homicidal Means: No Identified Victim: nonne History of harm to others?: No Assessment of Violence: None Noted Violent Behavior Description: none noted Does patient have access to weapons?: No Criminal Charges Pending?: No Does patient have a court date: No Is patient on probation?: No  Psychosis Hallucinations: None noted Delusions: None noted  Mental Status Report Appearance/Hygiene: Unremarkable Eye Contact: Fair Motor Activity: Freedom of movement Speech: Logical/coherent Level of Consciousness: Alert Mood: Depressed Affect: Depressed Anxiety Level: Minimal Thought Processes: Coherent, Relevant Judgement: Impaired Orientation: Person, Place, Time, Situation Obsessive Compulsive Thoughts/Behaviors: None  Cognitive Functioning Concentration: Normal Memory: Recent Intact, Remote Intact Is patient IDD: No Insight: Good Impulse Control: Fair Appetite: Good Have you had any weight changes? : No Change Sleep: No Change Total Hours of Sleep: 8 Vegetative Symptoms: None  ADLScreening [facility] Assessment Services) Patient's cognitive ability adequate to safely complete daily activities?: Yes Patient able to express need for assistance with ADLs?: Yes Independently performs ADLs?: Yes  (appropriate for developmental age)  Prior Inpatient Therapy Prior Inpatient Therapy: Yes Prior Therapy Dates: 2018 Prior Therapy Facilty/Provider(s): [facility], [facility] Reason for Treatment: substance use  Prior Outpatient Therapy Prior Outpatient Therapy: Yes Prior Therapy Dates: ongoing Prior Therapy Facilty/Provider(s): [facility] Reason for Treatment: med management Does patient have an ACCT team?: No Does patient have Intensive In-House Services?  : No Does patient have [facility] services? : No Does patient have P4CC services?: No  ADL Screening (condition at time of admission) Patient's cognitive ability adequate to safely complete daily activities?: Yes Is the patient deaf or have difficulty hearing?: No Does the patient have difficulty seeing, even when wearing glasses/contacts?: No Does the patient have difficulty concentrating, remembering, or making decisions?: No Patient able to express need for assistance with ADLs?: Yes Does the patient have difficulty dressing or bathing?: No Independently performs ADLs?: Yes (appropriate for developmental age) Does the patient have difficulty walking or climbing stairs?: No Weakness of Legs: None Weakness of Arms/Hands: None  Home Assistive Devices/Equipment Home Assistive Devices/Equipment: None  Therapy Consults (therapy consults require a physician order) PT Evaluation Needed: No OT Evalulation Needed: No SLP Evaluation Needed: No Abuse/Neglect Assessment (Assessment to be complete while patient is alone) Abuse/Neglect Assessment Can Be Completed: Yes Physical Abuse: Denies Verbal Abuse: Denies Sexual Abuse: Denies Exploitation of patient/patient's resources: Denies Self-Neglect: Denies Values / Beliefs Cultural Requests During Hospitalization: None Spiritual Requests During Hospitalization: None Consults Spiritual Care Consult Needed: No Social Work Consult Needed: No Merchant navy officerAdvance Directives (For Healthcare) Does Patient Have a  Medical  Advance Directive?: No Would patient like information on creating a medical advance directive?: No - Patient declined          Disposition: Per Malachy Chamberakia Starkes, PMHNP patient does not meet in patient criteria. Patient provided with detox resources. Disposition Initial Assessment Completed for this Encounter: Yes Disposition of Patient: Discharge Patient refused recommended treatment: No Mode of transportation if patient is discharged/movement?: Car Patient referred to: [facility], [facility], [facility]S, Outpatient clinic referral  On Site Evaluation by:   Reviewed with Physician:    Celedonio MiyamotoMeredith  Jonasia Coiner 11/24/2018 10:39 AM

## 2019-01-13 ENCOUNTER — Emergency Department (HOSPITAL_BASED_OUTPATIENT_CLINIC_OR_DEPARTMENT_OTHER)
Admission: EM | Admit: 2019-01-13 | Discharge: 2019-01-13 | Disposition: A | Discharge status: Home / Self Care | Payer: Self-pay | Attending: Emergency Medicine | Admitting: Emergency Medicine

## 2019-01-13 ENCOUNTER — Encounter (HOSPITAL_BASED_OUTPATIENT_CLINIC_OR_DEPARTMENT_OTHER): Payer: Self-pay | Admitting: Emergency Medicine

## 2019-01-13 ENCOUNTER — Other Ambulatory Visit: Payer: Self-pay

## 2019-01-13 DIAGNOSIS — Z79899 Other long term (current) drug therapy: Secondary | ICD-10-CM | POA: Insufficient documentation

## 2019-01-13 DIAGNOSIS — L0501 Pilonidal cyst with abscess: Secondary | ICD-10-CM

## 2019-01-13 DIAGNOSIS — F17210 Nicotine dependence, cigarettes, uncomplicated: Secondary | ICD-10-CM | POA: Insufficient documentation

## 2019-01-13 DIAGNOSIS — L0502 Pilonidal sinus with abscess: Secondary | ICD-10-CM | POA: Insufficient documentation

## 2019-01-13 MED ORDER — LIDOCAINE-EPINEPHRINE (PF) 2 %-1:200000 IJ SOLN
10.0000 mL | Freq: Once | INTRAMUSCULAR | Status: AC
  Administered 2019-01-13: 10 mL
  Filled 2019-01-13 (×2): qty 10

## 2019-01-13 NOTE — Discharge Instructions (Signed)
You were seen in the emergency department for an abscess on your buttocks.  The area was drained and pus was expressed.  We put a wick in which you can remove in 2 days by just gently pulling the fabric out.  You may take showers.  Please return if any fever or worsening symptoms.

## 2019-01-13 NOTE — ED Triage Notes (Signed)
Patient arrived via POV c/o pilonidal cyst flare up. Patient expresses hx of cyst. Patient states 7/10 pain. Patient is AO x 4, normal gait, VS WDL.

## 2019-01-13 NOTE — ED Provider Notes (Signed)
[facility] EMERGENCY DEPARTMENT Provider Note   CSN: [id number] Arrival date & time: 01/13/19  4010     History   Chief Complaint Chief Complaint  Patient presents with  . Cyst    HPI Bradley Hanson is a 28 y.o. male.  He is here with complaints of recurrent pilonidal cyst.  He says he has had a drained multiple times.  Michela Pitcher it started a few days ago and is becoming increasingly more painful.  Denies any drainage or fever.  He said they have offered him referral to general surgery but he does not have insurance so has not wanted to ever get it fixed.  No other medical complaints.  Patient rates his pain a 7 out of 10 and worse with any kind of manipulation.    The history is provided by the patient.  Abscess Location:  Pelvis Pelvic abscess location:  Gluteal cleft Size:  3 Abscess quality: fluctuance and painful   Abscess quality: not draining, no redness, no warmth and not weeping   Red streaking: no   Progression:  Worsening Pain details:    Quality:  Throbbing   Severity:  Moderate   Timing:  Constant   Progression:  Worsening Chronicity:  Recurrent Context: not diabetes   Relieved by:  Nothing Worsened by:  Nothing Ineffective treatments:  None tried Associated symptoms: no fever, no headaches, no nausea and no vomiting   Risk factors: prior abscess     Past Medical History:  Diagnosis Date  . Anxiety   . Depression   . Depression   . Migraine   . Overdose    OD on Tramadol 2013 [facility]  . Pilonidal cyst   . Pilonidal cyst with abscess 03/20/2011  . Seizures ([place])    pt estimates last seizure in 2013  . Substance abuse ([place])    opoids and triple c"s    Patient Active Problem List   Diagnosis Date Noted  . Major depressive disorder, recurrent severe without psychotic features ([place]) 06/10/2016  . Major depressive disorder, single episode, severe without psychosis ([place]) 06/10/2016  . Intentional heroin overdose ([place])   . Intentional drug overdose  ([place]) 01/14/2015  . Dental caries 12/22/2013  . 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 Pilonidal cyst with abscess 03/20/2011    Past Surgical History:  Procedure Laterality Date  . CHOLECYSTECTOMY  2006  . INCISE AND DRAIN ABCESS     pilo cyst  . MULTIPLE EXTRACTIONS WITH ALVEOLOPLASTY N/A 12/22/2013   Procedure: EXTRACTION OF TOOTH #30 WITH ALVEOLOPLASTY AND GROSS DEBRIDEMENT OF REMAINING TEETH ;  Surgeon: Lenn Cal, DDS;  Location: [facility];  Service: Oral Surgery;  Laterality: N/A;        Home Medications    Prior to Admission medications   Medication Sig Start Date End Date Taking? Authorizing Provider  cephALEXin (KEFLEX) 500 MG capsule Take 1 capsule (500 mg total) by mouth 4 (four) times daily. 01/23/17   Langston Masker B, PA-C  doxycycline (VIBRAMYCIN) 100 MG capsule Take 1 capsule (100 mg total) by mouth 2 (two) times daily. 03/08/17   Law, Bea Graff, PA-C  gabapentin (NEURONTIN) 300 MG capsule Take 1 capsule (300 mg total) by mouth 3 (three) times daily. 06/15/16   Derrill Center, NP  ibuprofen (ADVIL,MOTRIN) 600 MG tablet Take 1 tablet (600 mg total) by mouth every 6 (six) hours as needed. 01/23/17   Langston Masker B, PA-C  sertraline (ZOLOFT) 100 MG tablet Take 1 tablet (100 mg total) by  mouth daily. 06/16/16   Oneta RackLewis, Tanika N, NP    Family History Family History  Problem Relation Age of Onset  . Heart disease Paternal Grandmother     Social History Social History   Tobacco Use  . Smoking status: Current Every Day Smoker    Packs/day: 0.25    Types: Cigarettes  . Smokeless tobacco: Never Used  Substance Use Topics  . Alcohol use: No  . Drug use: No     Allergies   Patient has no known allergies.   Review of Systems Review of Systems  Constitutional: Negative for fever.  Gastrointestinal: Negative for nausea and vomiting.  Musculoskeletal: Positive for back pain.  Skin: Negative for rash.  Neurological: Negative for headaches.     Physical  Exam Updated Vital Signs BP (!) 135/95 (BP Location: Right Arm)   Pulse 94   Temp 98 F (36.7 C) (Oral)   Resp 18   Ht 6\' 3"  (1.905 m)   Wt 122.5 kg   SpO2 98%   BMI 33.75 kg/m   Physical Exam Vitals signs and nursing note reviewed.  Constitutional:      Appearance: He is well-developed.  HENT:     Head: Normocephalic and atraumatic.  Eyes:     Conjunctiva/sclera: Conjunctivae normal.  Neck:     Musculoskeletal: Neck supple.  Pulmonary:     Effort: Pulmonary effort is normal.  Skin:    General: Skin is warm and dry.     Comments: At the top of the patient's gluteal cleft has approximately 3 cm area of tender induration with a little bit of fluctuance.  Minimal overlying erythema.  No red streaking.  No crepitus.  Neurological:     General: No focal deficit present.     Mental Status: He is alert.     GCS: GCS eye subscore is 4. GCS verbal subscore is 5. GCS motor subscore is 6.      ED Treatments / Results  Labs (all labs ordered are listed, but only abnormal results are displayed) Labs Reviewed - No data to display  EKG None  Radiology No results found.  Procedures .Marland Kitchen.Incision and Drainage  Date/Time: 01/13/2019 7:27 AM Performed by: Terrilee FilesButler, Michael C, MD Authorized by: Terrilee FilesButler, Michael C, MD   Consent:    Consent obtained:  Verbal   Consent given by:  Patient   Risks discussed:  Bleeding, incomplete drainage, pain and infection   Alternatives discussed:  No treatment, delayed treatment and referral Location:    Type:  Pilonidal cyst   Size:  3   Location:  Anogenital   Anogenital location:  Pilonidal Pre-procedure details:    Skin preparation:  Betadine Anesthesia (see MAR for exact dosages):    Anesthesia method:  Local infiltration   Local anesthetic:  Lidocaine 2% WITH epi Procedure type:    Complexity:  Simple Procedure details:    Incision types:  Single straight   Scalpel blade:  15   Wound management:  Probed and deloculated   Drainage:   Purulent   Drainage amount:  Moderate   Packing materials:  1/4 in iodoform gauze   Amount 1/4" iodoform:  10 Post-procedure details:    Patient tolerance of procedure:  Tolerated well, no immediate complications   (including critical care time)  Medications Ordered in ED Medications  lidocaine-EPINEPHrine (XYLOCAINE W/EPI) 2 %-1:200000 (PF) injection 10 mL (has no administration in time range)     Initial Impression / Assessment and Plan / ED Course  I have reviewed the triage vital signs and the nursing notes.  Pertinent labs & imaging results that were available during my care of the patient were reviewed by me and considered in my medical decision making (see chart for details).         Final Clinical Impressions(s) / ED Diagnoses   Final diagnoses:  Pilonidal cyst with abscess    ED Discharge Orders    None       Terrilee Files, MD 01/13/19 [phone]

## 2021-07-26 ENCOUNTER — Encounter (HOSPITAL_BASED_OUTPATIENT_CLINIC_OR_DEPARTMENT_OTHER): Payer: Self-pay | Admitting: Emergency Medicine

## 2021-07-26 ENCOUNTER — Emergency Department (HOSPITAL_BASED_OUTPATIENT_CLINIC_OR_DEPARTMENT_OTHER)
Admission: EM | Admit: 2021-07-26 | Discharge: 2021-07-26 | Disposition: A | Discharge status: Home / Self Care | Payer: Self-pay | Attending: Emergency Medicine | Admitting: Emergency Medicine

## 2021-07-26 ENCOUNTER — Other Ambulatory Visit: Payer: Self-pay

## 2021-07-26 DIAGNOSIS — Z79899 Other long term (current) drug therapy: Secondary | ICD-10-CM | POA: Insufficient documentation

## 2021-07-26 DIAGNOSIS — E860 Dehydration: Secondary | ICD-10-CM | POA: Insufficient documentation

## 2021-07-26 DIAGNOSIS — T672XXA Heat cramp, initial encounter: Secondary | ICD-10-CM | POA: Insufficient documentation

## 2021-07-26 LAB — COMPREHENSIVE METABOLIC PANEL
ALT: 40 U/L (ref 0–44)
AST: 23 U/L (ref 15–41)
Albumin: 5.3 g/dL — ABNORMAL HIGH (ref 3.5–5.0)
Alkaline Phosphatase: 51 U/L (ref 38–126)
Anion gap: 11 (ref 5–15)
BUN: 20 mg/dL (ref 6–20)
CO2: 24 mmol/L (ref 22–32)
Calcium: 10.5 mg/dL — ABNORMAL HIGH (ref 8.9–10.3)
Chloride: 95 mmol/L — ABNORMAL LOW (ref 98–111)
Creatinine, Ser: 1.59 mg/dL — ABNORMAL HIGH (ref 0.61–1.24)
GFR, Estimated: 58 mL/min — ABNORMAL LOW (ref >60)
Glucose, Bld: 87 mg/dL (ref 70–99)
Potassium: 3.8 mmol/L (ref 3.5–5.1)
Sodium: 130 mmol/L — ABNORMAL LOW (ref 135–145)
Total Bilirubin: 1.1 mg/dL (ref 0.3–1.2)
Total Protein: 8.2 g/dL — ABNORMAL HIGH (ref 6.5–8.1)

## 2021-07-26 LAB — CBC WITH DIFFERENTIAL/PLATELET
Abs Immature Granulocytes: 0.11 10*3/uL — ABNORMAL HIGH (ref 0.00–0.07)
Basophils Absolute: 0.1 10*3/uL (ref 0.0–0.1)
Basophils Relative: 1 %
Eosinophils Absolute: 0.1 10*3/uL (ref 0.0–0.5)
Eosinophils Relative: 0 %
HCT: 44.7 % (ref 39.0–52.0)
Hemoglobin: 14.7 g/dL (ref 13.0–17.0)
Immature Granulocytes: 1 %
Lymphocytes Relative: 13 %
Lymphs Abs: 2.1 10*3/uL (ref 0.7–4.0)
MCH: 30.1 pg (ref 26.0–34.0)
MCHC: 32.9 g/dL (ref 30.0–36.0)
MCV: 91.4 fL (ref 80.0–100.0)
Monocytes Absolute: 1.2 10*3/uL — ABNORMAL HIGH (ref 0.1–1.0)
Monocytes Relative: 7 %
Neutro Abs: 13.2 10*3/uL — ABNORMAL HIGH (ref 1.7–7.7)
Neutrophils Relative %: 78 %
Platelets: 420 10*3/uL — ABNORMAL HIGH (ref 150–400)
RBC: 4.89 MIL/uL (ref 4.22–5.81)
RDW: 13.5 % (ref 11.5–15.5)
WBC: 16.8 10*3/uL — ABNORMAL HIGH (ref 4.0–10.5)
nRBC: 0 % (ref 0.0–0.2)

## 2021-07-26 LAB — MAGNESIUM: Magnesium: 2.1 mg/dL (ref 1.7–2.4)

## 2021-07-26 LAB — TROPONIN I (HIGH SENSITIVITY): Troponin I (High Sensitivity): 4 ng/L (ref <18)

## 2021-07-26 LAB — CK: Total CK: 300 U/L (ref 49–397)

## 2021-07-26 MED ORDER — LACTATED RINGERS IV BOLUS
1000.0000 mL | Freq: Once | INTRAVENOUS | Status: AC
Start: 2021-07-26 — End: 2021-07-26
  Administered 2021-07-26: 1000 mL via INTRAVENOUS

## 2021-07-26 NOTE — ED Triage Notes (Signed)
Pt arrives to ED via [facility] with c/o heat exposure. Pt reports working in the heat all day today and sweating throughout much of his day. Pt reports after work he developed generalized body cramps. Per EMS pt was given 500c bolus of NS in route. Pt denies LOC, headache, N/V, CP, SOB. Associated symptoms include diaphoresis.  ?

## 2021-07-26 NOTE — ED Provider Notes (Signed)
?[facility] EMERGENCY DEPT ?Provider Note ? ? ?CSN: [id number] ?Arrival date & time: 07/26/21  1758 ? ?  ? ?History ? ?Chief Complaint  ?Patient presents with  ? Heat Exposure  ? ? ?Bradley Hanson is a 31 y.o. male. ? ?HPI ? ?  ? 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  Sweats a lot while at work.   ? ?Started cramping up all over body, started building up around 2PM and got off work around 430PM and didn't feel like tolerable, was able to make it through rest of work day.  ? ?No nausea, vomiting, lightheadedness ?Had cramping in shoulders, abdomen, quads, hamstrings, back muscles ?Diaphoresis ?  ?One time was playin gfootball in high school and had similar cramping after practicing in the heat  ? ? ? ?Home Medications ?Prior to Admission medications   ?Medication Sig Start Date End Date Taking? Authorizing Provider  ?cephALEXin (KEFLEX) 500 MG capsule Take 1 capsule (500 mg total) by mouth 4 (four) times daily. 01/23/17   Langston Masker B, PA-C  ?doxycycline (VIBRAMYCIN) 100 MG capsule Take 1 capsule (100 mg total) by mouth 2 (two) times daily. 03/08/17   Frederica Kuster, PA-C  ?gabapentin (NEURONTIN) 300 MG capsule Take 1 capsule (300 mg total) by mouth 3 (three) times daily. 06/15/16   Derrill Center, NP  ?ibuprofen (ADVIL,MOTRIN) 600 MG tablet Take 1 tablet (600 mg total) by mouth every 6 (six) hours as needed. 01/23/17   Langston Masker B, PA-C  ?sertraline (ZOLOFT) 100 MG tablet Take 1 tablet (100 mg total) by mouth daily. 06/16/16   Derrill Center, NP  ?   ? ?Allergies    ?Patient has no known allergies.   ? ?Review of Systems   ?Review of Systems1 ? ?Physical Exam ?Updated Vital Signs ?BP (!) 148/103   Pulse 74   Temp 98.7 ?F (37.1 ?C)   Resp (!) 21   Ht 6\' 3"  (1.905 m)   Wt 122.5 kg   SpO2 100%   BMI 33.75 kg/m?  ?Physical  Exam ?Vitals and nursing note reviewed.  ?Constitutional:   ?   General: He is not in acute distress. ?   Appearance: He is well-developed. He is not diaphoretic.  ?HENT:  ?   Head: Normocephalic and atraumatic.  ?Eyes:  ?   Conjunctiva/sclera: Conjunctivae normal.  ?Cardiovascular:  ?   Rate and Rhythm: Normal rate and regular rhythm.  ?   Heart sounds: Normal heart sounds. No murmur heard. ?  No friction rub. No gallop.  ?Pulmonary:  ?   Effort: Pulmonary effort is normal. No respiratory distress.  ?   Breath sounds: Normal breath sounds. No wheezing or rales.  ?Abdominal:  ?   General: There is no distension.  ?   Palpations: Abdomen is soft.  ?   Tenderness: There is no abdominal tenderness. There is no guarding.  ?Musculoskeletal:  ?   Cervical back: Normal range of motion.  ?Skin: ?   General: Skin is warm and dry.  ?Neurological:  ?   Mental Status: He is alert and oriented to person, place, and time.  ? ? ?ED Results / Procedures / Treatments   ?Labs ?(all labs ordered are listed, but only abnormal results are displayed) ?Labs Reviewed  ?CBC WITH DIFFERENTIAL/PLATELET - Abnormal; Notable for the following components:  ?  Result Value  ? WBC 16.8 (*)   ? Platelets 420 (*)   ? Neutro Abs 13.2 (*)   ? Monocytes Absolute 1.2 (*)   ? Abs Immature Granulocytes 0.11 (*)   ? All other components within normal limits  ?COMPREHENSIVE METABOLIC PANEL - Abnormal; Notable for the following components:  ? Sodium 130 (*)   ? Chloride 95 (*)   ? Creatinine, Ser 1.59 (*)   ? Calcium 10.5 (*)   ? Total Protein 8.2 (*)   ? Albumin 5.3 (*)   ? GFR, Estimated 58 (*)   ? All other components within normal limits  ?MAGNESIUM  ?CK  ?TROPONIN I (HIGH SENSITIVITY)  ? ? ?EKG ?EKG Interpretation ? ?Date/Time:  Thursday July 26 2021 18:40:39 EDT ?Ventricular Rate:  95 ?PR Interval:  172 ?QRS Duration: 78 ?QT Interval:  354 ?QTC Calculation: 444 ?R Axis:   50 ?Text Interpretation: Normal sinus rhythm Normal ECG When compared with ECG  of 08-Jun-2016 23:07, No significant change since last tracing Confirmed by Gareth Morgan [phone]) on 07/26/2021 9:34:39 PM ? ?Radiology ?No results found. ? ?Procedures ?Procedures  ? ? ?Medications Ordered in ED ?Medications  ?lactated ringers bolus 1,000 mL (0 mLs Intravenous Stopped 07/26/21 2106)  ? ? ?ED Course/ Medical Decision Making/ A&P ?  ?                        ?Medical Decision Making ?Amount and/or Complexity of Data Reviewed ?Labs: ordered. ? ? ?30 year old male presents with concern for body cramping which began after working in the heat today. ? ?Labs were completed and personally evaluated by me and showed mild leukocytosis.  Does not have symptoms to suggest infection, and feel like this is likely reactive.  His labs show mild dehydration, with low sodium and chloride.  A CK was checked which was within normal limits.  His creatinine is mildly elevated with a GFr of 58.  Given 1 L of IV fluids in the emergency department, recommend continued oral hydration.  Also describes cramping going to the chest, and had EKG and troponin completed and without signs of fracture. ? ?Presentation most consistent with heat cramps related to working in the heat.  Discussed recommendation for continued hydration at home, as well as hydration prior to exertion Patient discharged in stable condition with understanding of reasons to return.  ? ? ? ? ? ? ? ?Final Clinical Impression(s) / ED Diagnoses ?Final diagnoses:  ?Heat cramps, initial encounter  ?Dehydration  ? ? ?Rx / DC Orders ?ED Discharge Orders   ? ? None  ? ?  ? ? ?  ?Gareth Morgan, MD ?07/29/21 [phone] ? ?

## 2022-05-19 ENCOUNTER — Emergency Department (HOSPITAL_BASED_OUTPATIENT_CLINIC_OR_DEPARTMENT_OTHER): Payer: Self-pay

## 2022-05-19 ENCOUNTER — Emergency Department (HOSPITAL_BASED_OUTPATIENT_CLINIC_OR_DEPARTMENT_OTHER)
Admission: EM | Admit: 2022-05-19 | Discharge: 2022-05-19 | Disposition: A | Discharge status: Home / Self Care | Payer: Self-pay | Attending: Emergency Medicine | Admitting: Emergency Medicine

## 2022-05-19 ENCOUNTER — Other Ambulatory Visit: Payer: Self-pay

## 2022-05-19 ENCOUNTER — Encounter (HOSPITAL_BASED_OUTPATIENT_CLINIC_OR_DEPARTMENT_OTHER): Payer: Self-pay | Admitting: Emergency Medicine

## 2022-05-19 DIAGNOSIS — R053 Chronic cough: Secondary | ICD-10-CM

## 2022-05-19 DIAGNOSIS — J320 Chronic maxillary sinusitis: Secondary | ICD-10-CM | POA: Insufficient documentation

## 2022-05-19 DIAGNOSIS — Z1152 Encounter for screening for COVID-19: Secondary | ICD-10-CM | POA: Insufficient documentation

## 2022-05-19 HISTORY — DX: Essential (primary) hypertension: I10

## 2022-05-19 LAB — RESP PANEL BY RT-PCR (RSV, FLU A&B, COVID)  RVPGX2
Influenza A by PCR: NEGATIVE
Influenza B by PCR: NEGATIVE
Resp Syncytial Virus by PCR: NEGATIVE
SARS Coronavirus 2 by RT PCR: NEGATIVE

## 2022-05-19 MED ORDER — METHYLPREDNISOLONE 4 MG PO TBPK: ORAL_TABLET | ORAL | 0 refills | Status: AC

## 2022-05-19 MED ORDER — AMOXICILLIN-POT CLAVULANATE 875-125 MG PO TABS: 1.0000 | ORAL_TABLET | Freq: Two times a day (BID) | ORAL | 0 refills | Status: AC

## 2022-05-19 NOTE — ED Triage Notes (Signed)
Pt arrives pov, sts "Ive been sick since Nov". Pt c/o cough, sinus congestion and drainage and throat irritation. Denies fever. Reports shob

## 2022-05-19 NOTE — Discharge Instructions (Signed)
As we discussed, you tested negative for COVID, flu, and RSV.  Your chest x-ray also showed no signs of pneumonia.  However given that you have been symptomatic for several months, I have prescribed you an antibiotic Augmentin as well as a steroid methylprednisone for management of your symptoms.  Please take these as prescribed in their entirety.  Also, it is very important that you continue to stop smoking as this is likely worsening your symptoms.  Follow-up with your primary care doctor for continued evaluation and management of the symptoms.  Return if development of any new or worsening symptoms.

## 2022-05-19 NOTE — ED Provider Notes (Signed)
[facility] Provider Note   CSN: [id number] Arrival date & time: 05/19/22  0909     History  Chief Complaint  Patient presents with   Cough    Bradley Hanson is a 32 y.o. male.  Patient with no pertinent past medical history presents today with complaints of cough and congestion. He states that he has been symptomatic since November when he came down with an upper respiratory infection with fevers, chills, congestion, cough, and bodyaches. He states that since then his fevers and bodyaches resolved, however his cough and congestion never really went away. He states that he has post nasal drip and cough productive of green sputum that is worse in the morning.  He denies any new changes in symptoms today, states he has been unable to see anyone yet because he is uninsured. He does smoke cigarettes and endorses trying to quit. He denies chest pain or shortness of breath.   The history is provided by the patient. No language interpreter was used.  Cough      Home Medications Prior to Admission medications   Medication Sig Start Date End Date Taking? Authorizing Provider  cephALEXin (KEFLEX) 500 MG capsule Take 1 capsule (500 mg total) by mouth 4 (four) times daily. 01/23/17   Langston Masker B, PA-C  doxycycline (VIBRAMYCIN) 100 MG capsule Take 1 capsule (100 mg total) by mouth 2 (two) times daily. 03/08/17   Law, Bea Graff, PA-C  gabapentin (NEURONTIN) 300 MG capsule Take 1 capsule (300 mg total) by mouth 3 (three) times daily. 06/15/16   Derrill Center, NP  ibuprofen (ADVIL,MOTRIN) 600 MG tablet Take 1 tablet (600 mg total) by mouth every 6 (six) hours as needed. 01/23/17   Langston Masker B, PA-C  sertraline (ZOLOFT) 100 MG tablet Take 1 tablet (100 mg total) by mouth daily. 06/16/16   Derrill Center, NP      Allergies    Patient has no known allergies.    Review of Systems   Review of Systems  HENT:  Positive for congestion.    Respiratory:  Positive for cough.   All other systems reviewed and are negative.   Physical Exam Updated Vital Signs BP (!) 160/116 (BP Location: Left Arm)   Pulse 96   Temp 98.9 F (37.2 C) (Oral)   Resp 15   Wt 129.3 kg   SpO2 100%   BMI 35.62 kg/m  Physical Exam Vitals and nursing note reviewed.  Constitutional:      General: He is not in acute distress.    Appearance: Normal appearance. He is normal weight. He is not ill-appearing, toxic-appearing or diaphoretic.  HENT:     Head: Normocephalic and atraumatic.     Nose: Nose normal.     Mouth/Throat:     Mouth: Mucous membranes are moist.     Pharynx: Oropharynx is clear. Uvula midline.     Tonsils: No tonsillar exudate or tonsillar abscesses.  Eyes:     Extraocular Movements: Extraocular movements intact.     Pupils: Pupils are equal, round, and reactive to light.  Cardiovascular:     Rate and Rhythm: Normal rate and regular rhythm.     Heart sounds: Normal heart sounds.  Pulmonary:     Effort: Pulmonary effort is normal. No respiratory distress.     Breath sounds: Normal breath sounds.  Abdominal:     General: Abdomen is flat.     Palpations: Abdomen is soft.  Musculoskeletal:  General: Normal range of motion.     Cervical back: Normal range of motion.  Skin:    General: Skin is warm and dry.  Neurological:     General: No focal deficit present.     Mental Status: He is alert.  Psychiatric:        Mood and Affect: Mood normal.        Behavior: Behavior normal.     ED Results / Procedures / Treatments   Labs (all labs ordered are listed, but only abnormal results are displayed) Labs Reviewed  RESP PANEL BY RT-PCR (RSV, FLU A&B, COVID)  [id number]    EKG None  Radiology DG Chest 2 View  Result Date: 05/19/2022 CLINICAL DATA:  Cough EXAM: CHEST - 2 VIEW COMPARISON:  Remote prior chest x-ray 01/13/2015 FINDINGS: The heart size and mediastinal contours are within normal limits. Both lungs are  clear. The visualized skeletal structures are unremarkable. IMPRESSION: No active cardiopulmonary disease. Electronically Signed   By: Jacqulynn Cadet M.D.   On: 05/19/2022 09:59    Procedures Procedures    Medications Ordered in ED Medications - No data to display  ED Course/ Medical Decision Making/ A&P                             Medical Decision Making Amount and/or Complexity of Data Reviewed Radiology: ordered.   Patient presents today with complaints of cough and congestion for several months. He is afebrile, non-toxic appearing, and in no acute distress with reassuring vital signs. Chest x-ray obtained which reveals no acute findings.  I have personally reviewed and interpreted this imaging and agree with radiology interpretation.  Negative for COVID, flu, and RSV today. Patient is without chest pain or shortness of breath. Given that he has been symptomatic for greater than 6 weeks, will treat with Augmentin. Will also give medrol dosepak. Patient is stable for discharge. Emphasized importance of smoking cessation as cigarette smoking is likely contributing to his symptoms. I have also emphasized importance of close pcp follow-up.  Patient is understanding and amenable with plan, educated on red flag symptoms that would prompt immediate return.  Patient discharged in stable condition.   Final Clinical Impression(s) / ED Diagnoses Final diagnoses:  Chronic maxillary sinusitis  Chronic cough    Rx / DC Orders ED Discharge Orders          Ordered    amoxicillin-clavulanate (AUGMENTIN) 875-125 MG tablet  Every 12 hours        05/19/22 1036    methylPREDNISolone (MEDROL DOSEPAK) 4 MG TBPK tablet        05/19/22 1036          An After Visit Summary was printed and given to the patient.     Nestor Lewandowsky 05/19/22 1039    Wyvonnia Dusky, MD 05/19/22 1102
